# Patient Record
Sex: Male | Born: 1985 | Race: White | Hispanic: No | Marital: Married | Smoking: Never smoker
Health system: Southern US, Community
[De-identification: ages and names within clinical notes are randomized; demographics above are authoritative.]

## PROBLEM LIST (undated history)

## (undated) DIAGNOSIS — T4145XA Adverse effect of unspecified anesthetic, initial encounter: Secondary | ICD-10-CM

## (undated) DIAGNOSIS — R112 Nausea with vomiting, unspecified: Secondary | ICD-10-CM

## (undated) DIAGNOSIS — T8859XA Other complications of anesthesia, initial encounter: Secondary | ICD-10-CM

## (undated) DIAGNOSIS — M1990 Unspecified osteoarthritis, unspecified site: Secondary | ICD-10-CM

## (undated) DIAGNOSIS — R011 Cardiac murmur, unspecified: Secondary | ICD-10-CM

## (undated) DIAGNOSIS — Z9889 Other specified postprocedural states: Secondary | ICD-10-CM

## (undated) DIAGNOSIS — S7223XA Displaced subtrochanteric fracture of unspecified femur, initial encounter for closed fracture: Secondary | ICD-10-CM

## (undated) HISTORY — PX: EYE SURGERY: SHX253

## (undated) HISTORY — PX: TONSILLECTOMY: SUR1361

---
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
## 2004-10-07 ENCOUNTER — Ambulatory Visit: Payer: Self-pay | Admitting: Family Medicine

## 2009-03-16 ENCOUNTER — Ambulatory Visit: Payer: Self-pay | Admitting: Internal Medicine

## 2009-03-16 DIAGNOSIS — J020 Streptococcal pharyngitis: Secondary | ICD-10-CM | POA: Insufficient documentation

## 2009-03-16 LAB — CONVERTED CEMR LAB: Rapid Strep: POSITIVE

## 2010-02-02 NOTE — Assessment & Plan Note (Signed)
Summary: NEW / [employer]/#/CD   Vital Signs:  Patient profile:   25 year old male Height:      65 inches Weight:      203 pounds BMI:     33.90 O2 Sat:      97 % on Room air Temp:     97.8 degrees F oral Pulse rate:   78 / minute Pulse rhythm:   regular Resp:     16 per minute BP sitting:   118 / 88  (left arm) Cuff size:   large  Vitals Entered By: Rock Nephew CMA (March 16, 2009 1:22 PM)  Nutrition Counseling: Patient's BMI is greater than 25 and therefore counseled on weight management options.  O2 Flow:  Room air CC: headache, congestion, sore throat, chills, fever x 2-3 days, URI symptoms Is Patient Diabetic? No   Primary Care Provider:  Etta Grandchild MD  CC:  headache, congestion, sore throat, chills, fever x 2-3 days, and URI symptoms.  History of Present Illness:  URI Symptoms      This is a 25 year old man who presents with URI symptoms.  The symptoms began 4 days ago.  The severity is described as moderate.  The patient reports sore throat and sick contacts, but denies nasal congestion, clear nasal discharge, purulent nasal discharge, dry cough, productive cough, and earache.  Associated symptoms include fever of 100.5-103 degrees, use of an antipyretic, and response to antipyretic.  The patient denies stiff neck, dyspnea, wheezing, rash, vomiting, and diarrhea.  The patient denies headache, muscle aches, and severe fatigue.  Risk factors for Strep sinusitis include Strep exposure, tender adenopathy, and absence of cough.  The patient denies the following risk factors for Strep sinusitis: unilateral facial pain, unilateral nasal discharge, poor response to decongestant, double sickening, and tooth pain.    Preventive Screening-Counseling & Management  Alcohol-Tobacco     Alcohol drinks/day: 2     Alcohol type: all     >5/day in last 3 mos: no     Alcohol Counseling: not indicated; use of alcohol is not excessive or problematic     Feels need to cut down: no  Feels annoyed by complaints: no     Feels guilty re: drinking: no     Needs 'eye opener' in am: no     Smoking Status: never  Caffeine-Diet-Exercise     Does Patient Exercise: yes  Hep-HIV-STD-Contraception     Hepatitis Risk: no risk noted     HIV Risk: no risk noted     STD Risk: no risk noted      Sexual History:  currently monogamous.        Drug Use:  no.        Blood Transfusions:  no.    Medications Prior to Update: 1)  None  Current Medications (verified): 1)  Moxatag 775 Mg Xr24h-Tab (Amoxicillin) .... One By Mouth Once Daily  Allergies (verified): No Known Drug Allergies  Past History:  Past Medical History: Unremarkable  Past Surgical History: Tonsillectomy  Family History: Family History Diabetes 1st degree relative Family History Hypertension  Social History: Reviewed history and no changes required. Occupation: IT sales professional Single Never Smoked Alcohol use-yes Drug use-no Regular exercise-yes Smoking Status:  never Hepatitis Risk:  no risk noted HIV Risk:  no risk noted STD Risk:  no risk noted Sexual History:  currently monogamous Blood Transfusions:  no Drug Use:  no Does Patient Exercise:  yes  Review of Systems  The patient complains of fever and enlarged lymph nodes.  The patient denies anorexia, weight loss, weight gain, chest pain, dyspnea on exertion, peripheral edema, prolonged cough, headaches, hemoptysis, abdominal pain, suspicious skin lesions, and angioedema.    Physical Exam  General:  Well-developed,well-nourished,in no acute distress; alert,appropriate and cooperative throughout examination Head:  Normocephalic and atraumatic without obvious abnormalities. No apparent alopecia or balding. Eyes:  No corneal or conjunctival inflammation noted. EOMI. Perrla. Funduscopic exam benign, without hemorrhages, exudates or papilledema. Vision grossly normal. Ears:  External ear exam shows no significant lesions or deformities.   Otoscopic examination reveals clear canals, tympanic membranes are intact bilaterally without bulging, retraction, inflammation or discharge. Hearing is grossly normal bilaterally. Nose:  External nasal examination shows no deformity or inflammation. Nasal mucosa are pink and moist without lesions or exudates. Mouth:  good dentition, no lesions, no aphthous ulcers, no erosions, no tongue abnormalities, no leukoplakia, no petechiae, pharyngeal erythema, tonsil hypertropied, and pharyngeal exudate.   Neck:  supple, full ROM, no masses, no thyromegaly, no thyroid nodules or tenderness, no JVD, and cervical lymphadenopathy.   Lungs:  normal respiratory effort, no intercostal retractions, no accessory muscle use, normal breath sounds, no dullness, no fremitus, no crackles, and no wheezes.   Heart:  normal rate, regular rhythm, no murmur, no gallop, no rub, and no JVD.   Abdomen:  soft, non-tender, normal bowel sounds, no distention, no masses, no guarding, no rigidity, no rebound tenderness, no hepatomegaly, and no splenomegaly.   Msk:  No deformity or scoliosis noted of thoracic or lumbar spine.   Pulses:  R and L carotid,radial,femoral,dorsalis pedis and posterior tibial pulses are full and equal bilaterally Extremities:  No clubbing, cyanosis, edema, or deformity noted with normal full range of motion of all joints.   Neurologic:  No cranial nerve deficits noted. Station and gait are normal. Plantar reflexes are down-going bilaterally. DTRs are symmetrical throughout. Sensory, motor and coordinative functions appear intact. Skin:  Intact without suspicious lesions or rashes Cervical Nodes:  R anterior LN tender and L anterior LN tender.   Axillary Nodes:  no R axillary adenopathy and no L axillary adenopathy.   Inguinal Nodes:  no R inguinal adenopathy and no L inguinal adenopathy.   Psych:  Cognition and judgment appear intact. Alert and cooperative with normal attention span and concentration. No  apparent delusions, illusions, hallucinations   Impression & Recommendations:  Problem # 1:  STREP THROAT (ICD-034.0) Assessment New  His updated medication list for this problem includes:    Moxatag 775 Mg Xr24h-tab (Amoxicillin) ..... One by mouth once daily  Instructed to complete antibiotics and call if not improved in 48 hours.   Orders: Rapid Strep (16109)  Complete Medication List: 1)  Moxatag 775 Mg Xr24h-tab (Amoxicillin) .... One by mouth once daily  Patient Instructions: 1)  Please schedule a follow-up appointment in 2 weeks. 2)  Get plenty of rest, drink lots of clear liquids, and use Tylenol or Ibuprofen for fever and comfort. Return in 7-10 days if you're not better:sooner if you're feeling worse. 3)  Take 650-1000mg  of Tylenol every 4-6 hours as needed for relief of pain or comfort of fever AVOID taking more than 4000mg   in a 24 hour period (can cause liver damage in higher doses). 4)  Take 400-600mg  of Ibuprofen (Advil, Motrin) with food every 4-6 hours as needed for relief of pain or comfort of fever. 5)  Take your antibiotic as prescribed until ALL of it is gone, but  stop if you develop a rash or swelling and contact our office as soon as possible. Prescriptions: MOXATAG 775 MG XR24H-TAB (AMOXICILLIN) One by mouth once daily  #10 x 0   Entered and Authorized by:   Etta Grandchild MD   Signed by:   Etta Grandchild MD on 03/16/2009   Method used:   Samples Given   RxID:   [id number]   Preventive Care Screening  Last Tetanus Booster:    Date:  01/03/2006    Results:  Tdap     Immunization History:  Influenza Immunization History:    Influenza:  historical (10/03/2008)  Laboratory Results    Other Tests  Rapid Strep: positive

## 2011-05-26 ENCOUNTER — Ambulatory Visit (INDEPENDENT_AMBULATORY_CARE_PROVIDER_SITE_OTHER): Payer: BC Managed Care – PPO | Admitting: Physician Assistant

## 2011-05-26 VITALS — BP 108/72 | HR 61 | Temp 98.4°F | Resp 16 | Ht 65.0 in | Wt 218.2 lb

## 2011-05-26 DIAGNOSIS — J029 Acute pharyngitis, unspecified: Secondary | ICD-10-CM

## 2011-05-26 DIAGNOSIS — J020 Streptococcal pharyngitis: Secondary | ICD-10-CM

## 2011-05-26 LAB — POCT RAPID STREP A (OFFICE): Rapid Strep A Screen: NEGATIVE

## 2011-05-26 MED ORDER — IPRATROPIUM BROMIDE 0.06 % NA SOLN: 2.0000 | Freq: Three times a day (TID) | NASAL | Status: DC

## 2011-05-26 MED ORDER — AZITHROMYCIN 250 MG PO TABS: ORAL_TABLET | ORAL | Status: AC

## 2011-05-26 NOTE — Progress Notes (Signed)
Patient ID: LORENA CLEARMAN MRN: [id number], DOB: Nov 19, 1985, 26 y.o. Date of Encounter: 05/26/2011, 8:38 AM  Primary Physician: Sanda Linger, MD, MD  Chief Complaint:  Chief Complaint  Patient presents with  . Sore Throat    X 2 days  . Nasal Congestion    HPI: 26 y.o. year old male presents with a two day history of sore throat and one day history of nasal congestion. Subjective fever and chills. Mild cough, and congestion. No sinus pressure, otalgia, or headache. Cough is nonproductive, and not associated with time of day. Normal hearing. No GI complaints. Able to swallow saliva, but hurts to do so. Decreased appetite secondary to sore throat. His last episode of strep throat started the exact same way. At that time he did have a negative RST, but throat culture confirmed strep throat. Multiple sick contacts at work, and with the public.   No past medical history on file.   Home Meds: Prior to Admission medications   Not on File    Allergies: No Known Allergies  History   Social History  . Marital Status: Single    Spouse Name: N/A    Number of Children: N/A  . Years of Education: N/A   Occupational History  . Not on file.   Social History Main Topics  . Smoking status: Never Smoker   . Smokeless tobacco: Never Used  . Alcohol Use: No  . Drug Use: No  . Sexually Active: Not on file   Other Topics Concern  . Not on file   Social History Narrative  . No narrative on file     Review of Systems: Constitutional: negative for night sweats or weight changes HEENT: see above Cardiovascular: negative for chest pain or palpitations Respiratory: negative for hemoptysis, wheezing, or shortness of breath Abdominal: negative for abdominal pain, nausea, vomiting or diarrhea Dermatological: negative for rash Neurologic: negative for headache   Physical Exam: Blood pressure 108/72, pulse 61, temperature 98.4 F (36.9 C), temperature source Oral, resp. rate 16, height  5\' 5"  (1.651 m), weight 218 lb 3.2 oz (98.975 kg), SpO2 98.00%., Body mass index is 36.31 kg/(m^2). General: Well developed, well nourished, in no acute distress. Head: Normocephalic, atraumatic, eyes without discharge, sclera non-icteric, nares are patent. Bilateral auditory canals clear, TM's are without perforation, pearly grey with reflective cone of light bilaterally. No sinus TTP. Oral cavity moist, dentition normal. Posterior pharynx with post nasal drip and mild erythema. No peritonsillar abscess or tonsillar exudate. Neck: Supple. No thyromegaly. Full ROM. <2cm AC. Lungs: Clear bilaterally to auscultation without wheezes, rales, or rhonchi. Breathing is unlabored. Heart: RRR with S1 S2. No murmurs, rubs, or gallops appreciated. Msk:  Strength and tone normal for age. Extremities: No clubbing or cyanosis. No edema. Neuro: Alert and oriented X 3. Moves all extremities spontaneously. CNII-XII grossly in tact. Psych:  Responds to questions appropriately with a normal affect.   Labs: Results for orders placed in visit on 05/26/11  POCT RAPID STREP A (OFFICE)      Component Value Range   Rapid Strep A Screen Negative  Negative     TC pending  ASSESSMENT AND PLAN:  26 y.o. year old male with likely strep pharyngitis, treat secondary to symptoms and past -Azithromycin 250 MG #6 2 po first day then 1 po next 4 days no RF -Atrovent NS 0.06% 2 sprays each nare bid prn #1 no RF -Tylenol/Motrin prn -TC pending -New toothbrush -Rest/fluids -RTC precautions -RTC 3-5 days  if no improvement  Signed, Eula Listen, PA-C 05/26/2011 8:38 AM

## 2011-05-29 LAB — CULTURE, GROUP A STREP: Organism ID, Bacteria: NORMAL

## 2012-02-11 ENCOUNTER — Ambulatory Visit (INDEPENDENT_AMBULATORY_CARE_PROVIDER_SITE_OTHER): Payer: BC Managed Care – PPO | Admitting: Physician Assistant

## 2012-02-11 VITALS — BP 118/88 | HR 87 | Temp 98.5°F | Resp 18 | Ht 64.5 in | Wt 219.0 lb

## 2012-02-11 DIAGNOSIS — R05 Cough: Secondary | ICD-10-CM

## 2012-02-11 DIAGNOSIS — J029 Acute pharyngitis, unspecified: Secondary | ICD-10-CM

## 2012-02-11 DIAGNOSIS — R059 Cough, unspecified: Secondary | ICD-10-CM

## 2012-02-11 DIAGNOSIS — J329 Chronic sinusitis, unspecified: Secondary | ICD-10-CM

## 2012-02-11 MED ORDER — BENZONATATE 100 MG PO CAPS: 100.0000 mg | ORAL_CAPSULE | Freq: Three times a day (TID) | ORAL | Status: DC | PRN

## 2012-02-11 MED ORDER — CEFDINIR 300 MG PO CAPS: 300.0000 mg | ORAL_CAPSULE | Freq: Two times a day (BID) | ORAL | Status: DC

## 2012-02-11 MED ORDER — IPRATROPIUM BROMIDE 0.06 % NA SOLN: 2.0000 | Freq: Two times a day (BID) | NASAL | Status: DC

## 2012-02-11 NOTE — Progress Notes (Signed)
  Subjective:    Patient ID: Curtis Adams, male    DOB: December 03, 1985, 27 y.o.   MRN: [id number]  HPI 27 year old male presents with 5 day history of nasal congestion, sore throat, productive cough, and sinus pressure.  Has been using OTC antihistamine which has not been helping.  Admits to ear pressure but denies pain.  No fever, chills, nausea, vomiting, abdominal pain, headache, or dizziness.  He is otherwise healthy with no other complaints today. States he has had co-workers with similar illness. He works at the [employer].     Review of Systems  Constitutional: Negative for fever and chills.  HENT: Positive for congestion, sore throat (scratchy), rhinorrhea and postnasal drip. Negative for ear pain, neck pain and neck stiffness.   Respiratory: Positive for cough. Negative for shortness of breath and wheezing.   Cardiovascular: Negative for chest pain.  Gastrointestinal: Negative for nausea, vomiting and abdominal pain.  Neurological: Negative for dizziness and headaches.  All other systems reviewed and are negative.       Objective:   Physical Exam  Constitutional: He is oriented to person, place, and time. He appears well-developed and well-nourished.  HENT:  Head: Normocephalic and atraumatic.  Right Ear: External ear normal.  Left Ear: External ear normal.  Mouth/Throat: Oropharynx is clear and moist. No oropharyngeal exudate (clear postnasal drainage).  Eyes: Conjunctivae are normal.  Neck: Normal range of motion. Neck supple.  Cardiovascular: Normal rate, regular rhythm and normal heart sounds.   Pulmonary/Chest: Effort normal and breath sounds normal.  Lymphadenopathy:    He has no cervical adenopathy.  Neurological: He is alert and oriented to person, place, and time.  Psychiatric: He has a normal mood and affect. His behavior is normal. Judgment and thought content normal.          Assessment & Plan:   1. Sinusitis  cefdinir (OMNICEF) 300 MG capsule    cefdinir (OMNICEF) 300 MG capsule   ipratropium (ATROVENT) 0.06 % nasal spray  2. Acute pharyngitis    3. Cough  benzonatate (TESSALON) 100 MG capsule   benzonatate (TESSALON) 100 MG capsule  Omnicef bid x 7 days Atrovent NS bid for congestion Mucinex as directed Tessalon perles tid prn cough Follow up if symptoms worsen or fail to improve.

## 2013-06-01 ENCOUNTER — Ambulatory Visit (INDEPENDENT_AMBULATORY_CARE_PROVIDER_SITE_OTHER): Payer: BC Managed Care – PPO | Admitting: Emergency Medicine

## 2013-06-01 VITALS — BP 118/80 | HR 76 | Temp 97.8°F | Resp 18 | Ht 64.5 in | Wt 204.0 lb

## 2013-06-01 DIAGNOSIS — J029 Acute pharyngitis, unspecified: Secondary | ICD-10-CM

## 2013-06-01 DIAGNOSIS — J329 Chronic sinusitis, unspecified: Secondary | ICD-10-CM

## 2013-06-01 LAB — POCT RAPID STREP A (OFFICE): Rapid Strep A Screen: NEGATIVE

## 2013-06-01 MED ORDER — AZELASTINE HCL 0.1 % NA SOLN: 2.0000 | Freq: Two times a day (BID) | NASAL | Status: DC

## 2013-06-01 MED ORDER — AMOXICILLIN 875 MG PO TABS: 875.0000 mg | ORAL_TABLET | Freq: Two times a day (BID) | ORAL | Status: DC

## 2013-06-01 NOTE — Progress Notes (Signed)
   Subjective:    Patient ID: Curtis Adams, male    DOB: [birth year], 28 y.o.   MRN: [id number]  Sinusitis Associated symptoms include congestion, sinus pressure and a sore throat. Pertinent negatives include no chills or coughing.   Chief Complaint  Patient presents with  . Sinusitis   This chart was scribed for Lesle Chris, MD by Andrew Au, ED Scribe. This patient was seen in room 9 and the patient's care was started at 9:21 AM.  HPI Comments: Curtis Adams is a 28 y.o. male who presents to the [facility] complaining of sinus congestion onset 1 week with associated sore throat, and nasal drainage consisting of yellow sputum. Pt states he feels as if he has water in his ears due to the congestion. Pt denies cough and chest congestion. Pt denies sick contacts. Pt denies being allergic to drugs.  Pt works for the [employer] in [place].  No past medical history on file. No Known Allergies Prior to Admission medications   Medication Sig Start Date End Date Taking? Authorizing Provider  benzonatate (TESSALON) 100 MG capsule Take 1-2 capsules (100-200 mg total) by mouth 3 (three) times daily as needed for cough. 02/11/12   Nelva Nay, PA-C  cefdinir (OMNICEF) 300 MG capsule Take 1 capsule (300 mg total) by mouth 2 (two) times daily. 02/11/12   Nelva Nay, PA-C  ipratropium (ATROVENT) 0.06 % nasal spray Place 2 sprays into the nose 2 (two) times daily. 02/11/12   Nelva Nay, PA-C   Review of Systems  Constitutional: Negative for fever and chills.  HENT: Positive for congestion, sinus pressure and sore throat.   Respiratory: Negative for cough.        Objective:   Physical Exam CONSTITUTIONAL: Well developed/well nourished HEAD: Normocephalic/atraumatic EYES: EOMI/PERRL ENMT: Mucous membranes moist, TM serous otitis media of both ears, significant congestion of nasal mucosa with purulent discharge  Bilaterally, Throat is red without  exudate. NECK: supple no meningeal signs SPINE:entire spine nontender CV: S1/S2 noted, no murmurs/rubs/gallops noted LUNGS: Lungs are clear to auscultation bilaterally, no apparent distress ABDOMEN: soft, nontender, no rebound or guarding GU:no cva tenderness NEURO: Pt is awake/alert, moves all extremitiesx4 EXTREMITIES: pulses normal, full ROM SKIN: warm, color normal PSYCH: no abnormalities of mood noted  Results for orders placed in visit on 06/01/13  POCT RAPID STREP A (OFFICE)      Result Value Ref Range   Rapid Strep A Screen Negative  Negative      Assessment & Plan:    1. Pharyngitis   2. Sinusitis    Meds ordered this encounter  Medications  . amoxicillin (AMOXIL) 875 MG tablet    Sig: Take 1 tablet (875 mg total) by mouth 2 (two) times daily.    Dispense:  20 tablet    Refill:  0  . azelastine (ASTELIN) 0.1 % nasal spray    Sig: Place 2 sprays into both nostrils 2 (two) times daily. Use in each nostril as directed    Dispense:  30 mL    Refill:  1   Choose an acute sinusitis. Recheck if symptoms are persistent.

## 2013-06-01 NOTE — Patient Instructions (Signed)

## 2013-06-27 ENCOUNTER — Ambulatory Visit (INDEPENDENT_AMBULATORY_CARE_PROVIDER_SITE_OTHER): Payer: BC Managed Care – PPO | Admitting: Family Medicine

## 2013-06-27 VITALS — BP 120/80 | HR 66 | Temp 97.9°F | Resp 16

## 2013-06-27 DIAGNOSIS — S0180XA Unspecified open wound of other part of head, initial encounter: Secondary | ICD-10-CM

## 2013-06-27 DIAGNOSIS — S0181XA Laceration without foreign body of other part of head, initial encounter: Secondary | ICD-10-CM

## 2013-06-27 DIAGNOSIS — Z23 Encounter for immunization: Secondary | ICD-10-CM

## 2013-06-27 NOTE — Progress Notes (Signed)
Procedure:  Consent obtained.  Wound cleaned and closed with 5-0 Ethilon #1 corner stitch with good approximation.  Drsg placed.

## 2013-06-27 NOTE — Patient Instructions (Signed)

## 2013-06-27 NOTE — Progress Notes (Signed)
   Subjective:    Patient ID: Curtis Adams, male    DOB: 04/11/1985, 28 y.o.   MRN: [id number]  HPI Patient presents this morning with an injury to his forehead. He bent down to open a door and hit his head on a metal plate. It bled a lot at first, but he was able to stop the bleeding with pressure. He did not have any loss of consciousness.   Last tetanus 2008.   Review of Systems No fever, no headache, minimal pain. No recent illness.    Objective:   Physical Exam  Vitals reviewed. Constitutional: He is oriented to person, place, and time. He appears well-developed and well-nourished.  HENT:  Head: Normocephalic. Head is with laceration.    Patient with Y shaped 1 cm x .5 cm laceration on forehead.   Eyes: Conjunctivae are normal. Right eye exhibits no discharge. Left eye exhibits no discharge. No scleral icterus.  Neck: Normal range of motion. Neck supple.  Cardiovascular: Normal rate.   Pulmonary/Chest: Effort normal.  Musculoskeletal: Normal range of motion.  Neurological: He is alert and oriented to person, place, and time.  Skin: Skin is warm and dry.  Psychiatric: He has a normal mood and affect. His behavior is normal. Judgment and thought content normal.       Assessment & Plan:  1. Need for Tdap vaccination - Tdap vaccine greater than or equal to 7yo IM  2. Laceration of forehead, initial encounter -Written and verbal wound instructions given to patient. To return in 7 days for suture removal, sooner if signs/symptoms infection.   Emi Belfasteborah B. Gessner, FNP-BC  [facility]  06/27/2013 10:46 AM

## 2015-05-13 ENCOUNTER — Ambulatory Visit (INDEPENDENT_AMBULATORY_CARE_PROVIDER_SITE_OTHER): Payer: BLUE CROSS/BLUE SHIELD | Admitting: Urgent Care

## 2015-05-13 VITALS — BP 120/88 | HR 88 | Temp 99.2°F | Resp 18 | Ht 66.5 in | Wt 221.0 lb

## 2015-05-13 DIAGNOSIS — R195 Other fecal abnormalities: Secondary | ICD-10-CM | POA: Diagnosis not present

## 2015-05-13 DIAGNOSIS — R509 Fever, unspecified: Secondary | ICD-10-CM | POA: Diagnosis not present

## 2015-05-13 DIAGNOSIS — R52 Pain, unspecified: Secondary | ICD-10-CM | POA: Diagnosis not present

## 2015-05-13 LAB — POCT URINALYSIS DIP (MANUAL ENTRY)
Bilirubin, UA: NEGATIVE
Glucose, UA: NEGATIVE
Ketones, POC UA: NEGATIVE
Leukocytes, UA: NEGATIVE
Nitrite, UA: NEGATIVE
Protein Ur, POC: NEGATIVE
Spec Grav, UA: 1.015
Urobilinogen, UA: 1
pH, UA: 6.5

## 2015-05-13 LAB — POCT CBC
Granulocyte percent: 78.3 %G (ref 37–80)
HCT, POC: 41 % — AB (ref 43.5–53.7)
Hemoglobin: 14.5 g/dL (ref 14.1–18.1)
Lymph, poc: 2.1 (ref 0.6–3.4)
MCH, POC: 31.3 pg — AB (ref 27–31.2)
MCHC: 35.3 g/dL (ref 31.8–35.4)
MCV: 88.7 fL (ref 80–97)
MID (cbc): 1.1 — AB (ref 0–0.9)
MPV: 7.4 fL (ref 0–99.8)
POC Granulocyte: 11.5 — AB (ref 2–6.9)
POC LYMPH PERCENT: 14.1 %L (ref 10–50)
POC MID %: 7.6 %M (ref 0–12)
Platelet Count, POC: 261 10*3/uL (ref 142–424)
RBC: 4.63 M/uL — AB (ref 4.69–6.13)
RDW, POC: 13.8 %
WBC: 14.7 10*3/uL — AB (ref 4.6–10.2)

## 2015-05-13 LAB — COMPREHENSIVE METABOLIC PANEL
ALT: 17 U/L (ref 9–46)
AST: 13 U/L (ref 10–40)
Albumin: 4.5 g/dL (ref 3.6–5.1)
Alkaline Phosphatase: 78 U/L (ref 40–115)
BUN: 16 mg/dL (ref 7–25)
CO2: 26 mmol/L (ref 20–31)
Calcium: 9 mg/dL (ref 8.6–10.3)
Chloride: 103 mmol/L (ref 98–110)
Creat: 0.89 mg/dL (ref 0.60–1.35)
Glucose, Bld: 93 mg/dL (ref 65–99)
Potassium: 4.4 mmol/L (ref 3.5–5.3)
Sodium: 139 mmol/L (ref 135–146)
Total Bilirubin: 1 mg/dL (ref 0.2–1.2)
Total Protein: 7.4 g/dL (ref 6.1–8.1)

## 2015-05-13 LAB — POCT GLYCOSYLATED HEMOGLOBIN (HGB A1C): Hemoglobin A1C: 5.2

## 2015-05-13 LAB — POCT INFLUENZA A/B
Influenza A, POC: NEGATIVE
Influenza B, POC: NEGATIVE

## 2015-05-13 LAB — SEDIMENTATION RATE: Sed Rate: 18 mm/hr — ABNORMAL HIGH (ref 0–15)

## 2015-05-13 MED ORDER — DOXYCYCLINE HYCLATE 100 MG PO CAPS: 100.0000 mg | ORAL_CAPSULE | Freq: Two times a day (BID) | ORAL | Status: DC

## 2015-05-13 NOTE — Patient Instructions (Addendum)
Doxycycline tablets or capsules What is this medicine? DOXYCYCLINE (dox i SYE kleen) is a tetracycline antibiotic. It kills certain bacteria or stops their growth. It is used to treat many kinds of infections, like dental, skin, respiratory, and urinary tract infections. It also treats acne, Lyme disease, malaria, and certain sexually transmitted infections. This medicine may be used for other purposes; ask your health care provider or pharmacist if you have questions. What should I tell my health care provider before I take this medicine? They need to know if you have any of these conditions: -liver disease -long exposure to sunlight like working outdoors -stomach problems like colitis -an unusual or allergic reaction to doxycycline, tetracycline antibiotics, other medicines, foods, dyes, or preservatives -pregnant or trying to get pregnant -breast-feeding How should I use this medicine? Take this medicine by mouth with a full glass of water. Follow the directions on the prescription label. It is best to take this medicine without food, but if it upsets your stomach take it with food. Take your medicine at regular intervals. Do not take your medicine more often than directed. Take all of your medicine as directed even if you think you are better. Do not skip doses or stop your medicine early. Talk to your pediatrician regarding the use of this medicine in children. While this drug may be prescribed for selected conditions, precautions do apply. Overdosage: If you think you have taken too much of this medicine contact a poison control center or emergency room at once. NOTE: This medicine is only for you. Do not share this medicine with others. What if I miss a dose? If you miss a dose, take it as soon as you can. If it is almost time for your next dose, take only that dose. Do not take double or extra doses. What may interact with this medicine? -antacids -barbiturates -birth control  pills -bismuth subsalicylate -carbamazepine -methoxyflurane -other antibiotics -phenytoin -vitamins that contain iron -warfarin This list may not describe all possible interactions. Give your health care provider a list of all the medicines, herbs, non-prescription drugs, or dietary supplements you use. Also tell them if you smoke, drink alcohol, or use illegal drugs. Some items may interact with your medicine. What should I watch for while using this medicine? Tell your doctor or health care professional if your symptoms do not improve. Do not treat diarrhea with over the counter products. Contact your doctor if you have diarrhea that lasts more than 2 days or if it is severe and watery. Do not take this medicine just before going to bed. It may not dissolve properly when you lay down and can cause pain in your throat. Drink plenty of fluids while taking this medicine to also help reduce irritation in your throat. This medicine can make you more sensitive to the sun. Keep out of the sun. If you cannot avoid being in the sun, wear protective clothing and use sunscreen. Do not use sun lamps or tanning beds/booths. Birth control pills may not work properly while you are taking this medicine. Talk to your doctor about using an extra method of birth control. If you are being treated for a sexually transmitted infection, avoid sexual contact until you have finished your treatment. Your sexual partner may also need treatment. Avoid antacids, aluminum, calcium, magnesium, and iron products for 4 hours before and 2 hours after taking a dose of this medicine. If you are using this medicine to prevent malaria, you should still protect yourself from contact  with mosquitos. Stay in screened-in areas, use mosquito nets, keep your body covered, and use an insect repellent. What side effects may I notice from receiving this medicine? Side effects that you should report to your doctor or health care professional  as soon as possible: -allergic reactions like skin rash, itching or hives, swelling of the face, lips, or tongue -difficulty breathing -fever -itching in the rectal or genital area -pain on swallowing -redness, blistering, peeling or loosening of the skin, including inside the mouth -severe stomach pain or cramps -unusual bleeding or bruising -unusually weak or tired -yellowing of the eyes or skin Side effects that usually do not require medical attention (report to your doctor or health care professional if they continue or are bothersome): -diarrhea -loss of appetite -nausea, vomiting This list may not describe all possible side effects. Call your doctor for medical advice about side effects. You may report side effects to FDA at 1-800-FDA-1088. Where should I keep my medicine? Keep out of the reach of children. Store at room temperature, below 30 degrees C (86 degrees F). Protect from light. Keep container tightly closed. Throw away any unused medicine after the expiration date. Taking this medicine after the expiration date can make you seriously ill. NOTE: This sheet is a summary. It may not cover all possible information. If you have questions about this medicine, talk to your doctor, pharmacist, or health care provider.    2016, Elsevier/Gold Standard. (2014-04-11 12:10:28)     IF you received an x-ray today, you will receive an invoice from [facility]. Please contact [facility] at [phone] with questions or concerns regarding your invoice.   IF you received labwork today, you will receive an invoice from [facility]. Please contact [facility] at [phone] with questions or concerns regarding your invoice.   Our billing staff will not be able to assist you with questions regarding bills from these companies.  You will be contacted with the lab results as soon as they are available. The fastest way to get your results is to  activate your My Chart account. Instructions are located on the last page of this paperwork. If you have not heard from us regarding the results in 2 weeks, please contact this office.

## 2015-05-13 NOTE — Progress Notes (Signed)
MRN: [id number] DOB: 1985-12-28  Subjective:   Curtis Adams is a 30 y.o. male presenting for chief complaint of Fever; Diarrhea; and Generalized Body Aches  Reports 3 day history of worsening body aches, fever (highest 101F), intermittent stomach cramping, loose stools which are now improved. Has also had nasal congestion, drainage. Has tried Advil with some relief and Imodium which resolved the loose stools. Of note, patient has spent a lot of time outdoors but has not seen or felt any tick bites in particular. Denies nausea, vomiting, belly pain, chest pain, shob, sore throat, cough, rashes, red eyes. Does not actively practice healthy diet, no exercise. Denies smoking cigarettes or drinking alcohol.   Curtis Adams currently has no medications in their medication list. Also has No Known Allergies.  Curtis Adams  has no past medical history on file. Also  has past surgical history that includes Eye surgery.  Family history is negative for diabetes, heart disease, cancer.   Objective:   Vitals: BP 120/88 mmHg  Pulse 88  Temp(Src) 99.2 F (37.3 C) (Oral)  Resp 18  Ht 5' 6.5" (1.689 m)  Wt 221 lb (100.245 kg)  BMI 35.14 kg/m2  SpO2 98%  Physical Exam  Constitutional: He is oriented to person, place, and time. He appears well-developed and well-nourished.  HENT:  Mouth/Throat: Oropharynx is clear and moist.  Eyes: Pupils are equal, round, and reactive to light. Right eye exhibits no discharge. Left eye exhibits no discharge. No scleral icterus.  Neck: Normal range of motion. Neck supple.  Cardiovascular: Normal rate, regular rhythm and intact distal pulses.  Exam reveals no gallop and no friction rub.   No murmur heard. Pulmonary/Chest: No respiratory distress. He has no wheezes. He has no rales.  Abdominal: Soft. Bowel sounds are normal. He exhibits no distension and no mass. There is no tenderness.  No hepatosplenomegaly.  Musculoskeletal: He exhibits no edema.  Lymphadenopathy:   He has no cervical adenopathy.  Neurological: He is alert and oriented to person, place, and time.  Skin: Skin is warm and dry. No rash noted.   Results for orders placed or performed in visit on 05/13/15 (from the past 24 hour(s))  POCT urinalysis dipstick     Status: Abnormal   Collection Time: 05/13/15 10:29 AM  Result Value Ref Range   Color, UA yellow yellow   Clarity, UA clear clear   Glucose, UA negative negative   Bilirubin, UA negative negative   Ketones, POC UA negative negative   Spec Grav, UA 1.015    Blood, UA trace-lysed (A) negative   pH, UA 6.5    Protein Ur, POC negative negative   Urobilinogen, UA 1.0    Nitrite, UA Negative Negative   Leukocytes, UA Negative Negative  POCT Influenza A/B     Status: None   Collection Time: 05/13/15 10:38 AM  Result Value Ref Range   Influenza A, POC Negative Negative   Influenza B, POC Negative Negative  POCT CBC     Status: Abnormal   Collection Time: 05/13/15 10:40 AM  Result Value Ref Range   WBC 14.7 (A) 4.6 - 10.2 K/uL   Lymph, poc 2.1 0.6 - 3.4   POC LYMPH PERCENT 14.1 10 - 50 %L   MID (cbc) 1.1 (A) 0 - 0.9   POC MID % 7.6 0 - 12 %M   POC Granulocyte 11.5 (A) 2 - 6.9   Granulocyte percent 78.3 37 - 80 %G   RBC 4.63 (A) 4.69 -  6.13 M/uL   Hemoglobin 14.5 14.1 - 18.1 g/dL   HCT, POC 16.141.0 (A) 09.643.5 - 53.7 %   MCV 88.7 80 - 97 fL   MCH, POC 31.3 (A) 27 - 31.2 pg   MCHC 35.3 31.8 - 35.4 g/dL   RDW, POC 04.513.8 %   Platelet Count, POC 261 142 - 424 K/uL   MPV 7.4 0 - 99.8 fL  POCT glycosylated hemoglobin (Hb A1C)     Status: None   Collection Time: 05/13/15 10:40 AM  Result Value Ref Range   Hemoglobin A1C 5.2    Assessment and Plan :   1. Fever, unspecified 2. Body aches 3. Loose stools - Will cover for infectious process given elevated wbc with left shift. Start doxycycline, reviewed potential adverse effects, patient was in agreement. RTC in 1 week if no improvement.  Wallis BambergMario Akshath Mccarey, PA-C [facility] [phone] 05/13/2015 10:00 AM

## 2015-05-15 LAB — ROCKY MTN SPOTTED FVR ABS PNL(IGG+IGM)
RMSF IgG: NOT DETECTED
RMSF IgM: NOT DETECTED

## 2015-07-28 ENCOUNTER — Ambulatory Visit (INDEPENDENT_AMBULATORY_CARE_PROVIDER_SITE_OTHER): Payer: BLUE CROSS/BLUE SHIELD | Admitting: Physician Assistant

## 2015-07-28 VITALS — BP 124/86 | HR 86 | Temp 98.3°F | Resp 17 | Ht 66.5 in | Wt 220.0 lb

## 2015-07-28 DIAGNOSIS — J069 Acute upper respiratory infection, unspecified: Secondary | ICD-10-CM | POA: Diagnosis not present

## 2015-07-28 MED ORDER — AZITHROMYCIN 250 MG PO TABS: ORAL_TABLET | ORAL | 0 refills | Status: AC

## 2015-07-28 MED ORDER — PREDNISONE 20 MG PO TABS: 40.0000 mg | ORAL_TABLET | Freq: Every day | ORAL | 0 refills | Status: DC

## 2015-07-28 MED ORDER — BENZONATATE 200 MG PO CAPS: 200.0000 mg | ORAL_CAPSULE | Freq: Two times a day (BID) | ORAL | 0 refills | Status: DC | PRN

## 2015-07-28 NOTE — Progress Notes (Signed)
07/28/2015 4:43 PM   DOB: [birth year] / MRN: [id number]  SUBJECTIVE:  Curtis Adams is a 30 y.o. male presenting for cough and nasal congestion that has been present for about 3 weeks. Reports this started with a sore throat which has since resolved.  He has been taking benadryl, delsym, pseudoephedrine with some relief.   He reports the occasional HA with these symptoms.  He denies teeth pain, myalgia, appetite changes. He is a never smoker.   He has No Known Allergies.   He  has no past medical history on file.    He  reports that he has never smoked. He has never used smokeless tobacco. He reports that he does not drink alcohol or use drugs. He  has no sexual activity history on file. The patient  has a past surgical history that includes Eye surgery.  His family history is not on file.  Review of Systems  Constitutional: Negative for chills and fever.  HENT: Negative for ear pain.   Respiratory: Negative for hemoptysis.   Cardiovascular: Negative for chest pain.  Skin: Negative for rash.  Neurological: Negative for dizziness.    Problem list and medications reviewed and updated by myself where necessary, and exist elsewhere in the encounter.   OBJECTIVE:  BP 124/86 (BP Location: Right Arm, Patient Position: Sitting, Cuff Size: Normal)   Pulse 86   Temp 98.3 F (36.8 C) (Oral)   Resp 17   Ht 5' 6.5" (1.689 m)   Wt 220 lb (99.8 kg)   SpO2 98%   BMI 34.98 kg/m   Physical Exam  Constitutional: He is oriented to person, place, and time. He appears well-developed. He does not appear ill.  HENT:  Nose: Mucosal edema (beefy red) present. Right sinus exhibits no maxillary sinus tenderness and no frontal sinus tenderness. Left sinus exhibits no maxillary sinus tenderness and no frontal sinus tenderness.  Eyes: Conjunctivae and EOM are normal. Pupils are equal, round, and reactive to light.  Cardiovascular: Normal rate, regular rhythm and normal heart sounds.   Pulmonary/Chest:  Effort normal. No respiratory distress. He has no wheezes. He has no rales. He exhibits no tenderness.  Abdominal: He exhibits no distension.  Musculoskeletal: Normal range of motion.  Neurological: He is alert and oriented to person, place, and time. No cranial nerve deficit. Coordination normal.  Skin: Skin is warm and dry. He is not diaphoretic.  Psychiatric: He has a normal mood and affect.  Nursing note and vitals reviewed.   No results found for this or any previous visit (from the past 72 hour(s)).  No results found.  ASSESSMENT AND PLAN  Curtis Adams was seen today for cough and nasal congestion.  Diagnoses and all orders for this visit:  Protracted URI: He has been feeling poorly for two weeks now.  He may have contracted another cold or this could be a true sinusitis.  Will go ahead and treat with ABX based on symptom duration.   -     predniSONE (DELTASONE) 20 MG tablet; Take 2 tablets (40 mg total) by mouth daily with breakfast. -     azithromycin (ZITHROMAX) 250 MG tablet; Take 2 tabs PO x 1 dose, then 1 tab PO QD x 4 days -     benzonatate (TESSALON) 200 MG capsule; Take 1 capsule (200 mg total) by mouth 2 (two) times daily as needed for cough.    The patient was advised to call or return to clinic if he does not see  an improvement in symptoms, or to seek the care of the closest emergency department if he worsens with the above plan.   Deliah Boston, MHS, PA-C [facility] 07/28/2015 4:43 PM

## 2015-07-28 NOTE — Patient Instructions (Signed)
     IF you received an x-ray today, you will receive an invoice from [facility]. Please contact [facility] at [phone] with questions or concerns regarding your invoice.   IF you received labwork today, you will receive an invoice from [facility]. Please contact [facility] at [phone] with questions or concerns regarding your invoice.   Our billing staff will not be able to assist you with questions regarding bills from these companies.  You will be contacted with the lab results as soon as they are available. The fastest way to get your results is to activate your My Chart account. Instructions are located on the last page of this paperwork. If you have not heard from us regarding the results in 2 weeks, please contact this office.      

## 2016-10-04 DIAGNOSIS — R197 Diarrhea, unspecified: Secondary | ICD-10-CM | POA: Diagnosis not present

## 2016-10-04 DIAGNOSIS — K529 Noninfective gastroenteritis and colitis, unspecified: Secondary | ICD-10-CM | POA: Diagnosis not present

## 2017-01-11 DIAGNOSIS — M9905 Segmental and somatic dysfunction of pelvic region: Secondary | ICD-10-CM | POA: Diagnosis not present

## 2017-01-11 DIAGNOSIS — M545 Low back pain: Secondary | ICD-10-CM | POA: Diagnosis not present

## 2017-01-11 DIAGNOSIS — M9903 Segmental and somatic dysfunction of lumbar region: Secondary | ICD-10-CM | POA: Diagnosis not present

## 2017-01-11 DIAGNOSIS — M1612 Unilateral primary osteoarthritis, left hip: Secondary | ICD-10-CM | POA: Diagnosis not present

## 2017-01-11 DIAGNOSIS — M979XXS Periprosthetic fracture around unspecified internal prosthetic joint, sequela: Secondary | ICD-10-CM | POA: Diagnosis not present

## 2017-01-19 DIAGNOSIS — B349 Viral infection, unspecified: Secondary | ICD-10-CM | POA: Diagnosis not present

## 2017-01-19 DIAGNOSIS — J069 Acute upper respiratory infection, unspecified: Secondary | ICD-10-CM | POA: Diagnosis not present

## 2017-01-29 DIAGNOSIS — J0100 Acute maxillary sinusitis, unspecified: Secondary | ICD-10-CM | POA: Diagnosis not present

## 2017-02-17 DIAGNOSIS — M25552 Pain in left hip: Secondary | ICD-10-CM | POA: Diagnosis not present

## 2017-02-17 DIAGNOSIS — M1612 Unilateral primary osteoarthritis, left hip: Secondary | ICD-10-CM | POA: Diagnosis not present

## 2017-02-27 DIAGNOSIS — M25552 Pain in left hip: Secondary | ICD-10-CM | POA: Diagnosis not present

## 2017-03-06 DIAGNOSIS — M25552 Pain in left hip: Secondary | ICD-10-CM | POA: Diagnosis not present

## 2017-04-03 DIAGNOSIS — M25552 Pain in left hip: Secondary | ICD-10-CM | POA: Diagnosis not present

## 2017-04-05 ENCOUNTER — Other Ambulatory Visit: Payer: Self-pay

## 2017-04-05 ENCOUNTER — Other Ambulatory Visit: Payer: Self-pay | Admitting: Orthopaedic Surgery

## 2017-04-05 ENCOUNTER — Encounter (HOSPITAL_COMMUNITY): Payer: Self-pay | Admitting: *Deleted

## 2017-04-05 DIAGNOSIS — M84352A Stress fracture, left femur, initial encounter for fracture: Secondary | ICD-10-CM | POA: Diagnosis present

## 2017-04-05 NOTE — H&P (Signed)
Curtis Adams is an 32 y.o. male.   Chief Complaint: Left hip pain HPI: Curtis Adams is here again for follow-up of his left hip.  He's feeling more left groin pain.  We have him partial weightbearing with one crutch.  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  He is in with his mom, Curtis Adams, who has a visit at the same time.  He has some trouble resting at night.  He thinks overall things are worse despite this past month of relative rest.   Radiographs:  X-rays that were ordered, performed, and interpreted by me today included AP pelvis.  He has the same black line in the subtrochanteric region of the hip in the medial cortex.  If anything this looks a little bit worse.  He has some hypertrophy of the cortex consistent with attempted healing.  We reviewed his MRI scan at the last visit which was read normal by the radiologist.  We spoke with Dr. Jena GaussMaxwell who amended it to show that he does have a stress fracture in the subtrochanteric region.  No past medical history on file.  Past Surgical History:  Procedure Laterality Date  . EYE SURGERY      No family history on file. Social History:  reports that he has never smoked. He has never used smokeless tobacco. He reports that he does not drink alcohol or use drugs.  Allergies: No Known Allergies  No medications prior to admission.    No results found for this or any previous visit (from the past 48 hour(s)). No results found.  Review of Systems  Musculoskeletal: Positive for joint pain.       Left hip  All other systems reviewed and are negative.   There were no vitals taken for this visit. Physical Exam  Constitutional: He is oriented to person, place, and time. He appears well-developed and well-nourished.  HENT:  Head: Normocephalic and atraumatic.  Eyes: Pupils are equal, round, and reactive to light.  Neck: Normal range of motion.  Cardiovascular: Normal rate and regular rhythm.   Respiratory: Effort normal.  GI: Soft.  Musculoskeletal:  Left hip motion is painful.  He still is pain on internal rotation.  Leg lengths look equal.  He walks with an altered gait.  His skin is benign.  Sensation and motor function are intact distally with palpable pulses in his feet.    Neurological: He is alert and oriented to person, place, and time.  Skin: Skin is warm and dry.  Psychiatric: He has a normal mood and affect. His behavior is normal. Judgment and thought content normal.    Assessment/Plan Assessment: Left hip subtrochanteric stress fracture  Plan: Curtis Adams continues to struggle despite protected weightbearing status for the last month.  The x-ray today if anything looks worse.  There is certainly no emergency at work but I think his best option at this point would be intramedullary fixation.  He can certainly give it another month and continue on the crutch and follow-up though I doubt this is going to heal with anything short of intramedullary fixation.  If he comes to the surgery we can [facility] on an [facility] basis [facility] or the surgical center.  I would probably place a Biomet reconstruction type nail and leave it unlocked distally.  I reviewed risk of anesthesia and infection related to this intervention.  Maryiah Olvey, Ginger OrganNDREW PAUL, PA-C 04/05/2017, 1:51 PM

## 2017-04-05 NOTE — Progress Notes (Addendum)
Spoke with pt for pre-op call. Pt denies cardiac history, HTN or Diabetes. 

## 2017-04-06 ENCOUNTER — Encounter (HOSPITAL_COMMUNITY)
Admission: RE | Disposition: A | Discharge status: Home / Self Care | Payer: Self-pay | Source: Ambulatory Visit | Attending: Orthopaedic Surgery

## 2017-04-06 ENCOUNTER — Ambulatory Visit (HOSPITAL_COMMUNITY)
Admission: RE | Admit: 2017-04-06 | Discharge: 2017-04-06 | Disposition: A | Discharge status: Home / Self Care | Payer: BLUE CROSS/BLUE SHIELD | Source: Ambulatory Visit | Attending: Orthopaedic Surgery | Admitting: Orthopaedic Surgery

## 2017-04-06 ENCOUNTER — Encounter (HOSPITAL_COMMUNITY): Payer: Self-pay

## 2017-04-06 ENCOUNTER — Ambulatory Visit (HOSPITAL_COMMUNITY): Payer: BLUE CROSS/BLUE SHIELD

## 2017-04-06 ENCOUNTER — Ambulatory Visit (HOSPITAL_COMMUNITY): Payer: BLUE CROSS/BLUE SHIELD | Admitting: Anesthesiology

## 2017-04-06 DIAGNOSIS — S7292XD Unspecified fracture of left femur, subsequent encounter for closed fracture with routine healing: Secondary | ICD-10-CM | POA: Diagnosis not present

## 2017-04-06 DIAGNOSIS — M84352A Stress fracture, left femur, initial encounter for fracture: Secondary | ICD-10-CM

## 2017-04-06 DIAGNOSIS — Z419 Encounter for procedure for purposes other than remedying health state, unspecified: Secondary | ICD-10-CM

## 2017-04-06 DIAGNOSIS — Z6835 Body mass index (BMI) 35.0-35.9, adult: Secondary | ICD-10-CM | POA: Insufficient documentation

## 2017-04-06 DIAGNOSIS — E669 Obesity, unspecified: Secondary | ICD-10-CM | POA: Insufficient documentation

## 2017-04-06 DIAGNOSIS — J392 Other diseases of pharynx: Secondary | ICD-10-CM | POA: Diagnosis not present

## 2017-04-06 HISTORY — DX: Other complications of anesthesia, initial encounter: T88.59XA

## 2017-04-06 HISTORY — DX: Displaced subtrochanteric fracture of unspecified femur, initial encounter for closed fracture: S72.23XA

## 2017-04-06 HISTORY — DX: Unspecified osteoarthritis, unspecified site: M19.90

## 2017-04-06 HISTORY — PX: FEMUR IM NAIL: SHX1597

## 2017-04-06 HISTORY — DX: Other specified postprocedural states: R11.2

## 2017-04-06 HISTORY — DX: Adverse effect of unspecified anesthetic, initial encounter: T41.45XA

## 2017-04-06 HISTORY — DX: Cardiac murmur, unspecified: R01.1

## 2017-04-06 HISTORY — DX: Other specified postprocedural states: Z98.890

## 2017-04-06 LAB — CBC
HCT: 43.6 % (ref 39.0–52.0)
Hemoglobin: 14.6 g/dL (ref 13.0–17.0)
MCH: 30.4 pg (ref 26.0–34.0)
MCHC: 33.5 g/dL (ref 30.0–36.0)
MCV: 90.8 fL (ref 78.0–100.0)
Platelets: 312 10*3/uL (ref 150–400)
RBC: 4.8 MIL/uL (ref 4.22–5.81)
RDW: 13.1 % (ref 11.5–15.5)
WBC: 9 10*3/uL (ref 4.0–10.5)

## 2017-04-06 LAB — BASIC METABOLIC PANEL
Anion gap: 11 (ref 5–15)
BUN: 16 mg/dL (ref 6–20)
CO2: 21 mmol/L — ABNORMAL LOW (ref 22–32)
Calcium: 8.9 mg/dL (ref 8.9–10.3)
Chloride: 104 mmol/L (ref 101–111)
Creatinine, Ser: 0.83 mg/dL (ref 0.61–1.24)
GFR calc Af Amer: >60 mL/min (ref >60)
GFR calc non Af Amer: >60 mL/min (ref >60)
Glucose, Bld: 94 mg/dL (ref 65–99)
Potassium: 4.6 mmol/L (ref 3.5–5.1)
Sodium: 136 mmol/L (ref 135–145)

## 2017-04-06 SURGERY — INSERTION, INTRAMEDULLARY ROD, FEMUR
Anesthesia: General | Site: Hip | Laterality: Left

## 2017-04-06 MED ORDER — LACTATED RINGERS IV SOLN
INTRAVENOUS | Status: DC
  Administered 2017-04-06: 08:00:00 via INTRAVENOUS

## 2017-04-06 MED ORDER — HYDROMORPHONE HCL 1 MG/ML IJ SOLN
0.2500 mg | INTRAMUSCULAR | Status: DC | PRN
  Administered 2017-04-06 (×3): 0.5 mg via INTRAVENOUS

## 2017-04-06 MED ORDER — PROMETHAZINE HCL 12.5 MG PO TABS: 12.5000 mg | ORAL_TABLET | Freq: Four times a day (QID) | ORAL | 0 refills | Status: DC | PRN

## 2017-04-06 MED ORDER — LIDOCAINE HCL (CARDIAC) 20 MG/ML IV SOLN
INTRAVENOUS | Status: DC | PRN
  Administered 2017-04-06: 60 mg via INTRAVENOUS

## 2017-04-06 MED ORDER — PROPOFOL 10 MG/ML IV BOLUS
INTRAVENOUS | Status: AC
  Filled 2017-04-06: qty 40

## 2017-04-06 MED ORDER — OXYCODONE-ACETAMINOPHEN 5-325 MG PO TABS: 1.0000 | ORAL_TABLET | Freq: Four times a day (QID) | ORAL | 0 refills | Status: DC | PRN

## 2017-04-06 MED ORDER — KETOROLAC TROMETHAMINE 30 MG/ML IJ SOLN
INTRAMUSCULAR | Status: AC
  Filled 2017-04-06: qty 1

## 2017-04-06 MED ORDER — DEXAMETHASONE SODIUM PHOSPHATE 10 MG/ML IJ SOLN
INTRAMUSCULAR | Status: AC
  Filled 2017-04-06: qty 1

## 2017-04-06 MED ORDER — OXYCODONE-ACETAMINOPHEN 5-325 MG PO TABS
1.0000 | ORAL_TABLET | Freq: Once | ORAL | Status: AC
  Administered 2017-04-06: 1 via ORAL

## 2017-04-06 MED ORDER — ONDANSETRON HCL 4 MG/2ML IJ SOLN
INTRAMUSCULAR | Status: AC
  Filled 2017-04-06: qty 2

## 2017-04-06 MED ORDER — LIDOCAINE HCL (CARDIAC) 20 MG/ML IV SOLN
INTRAVENOUS | Status: AC
  Filled 2017-04-06: qty 5

## 2017-04-06 MED ORDER — CEFAZOLIN SODIUM-DEXTROSE 2-4 GM/100ML-% IV SOLN
2.0000 g | INTRAVENOUS | Status: AC
  Administered 2017-04-06: 2 g via INTRAVENOUS

## 2017-04-06 MED ORDER — BUPIVACAINE IN DEXTROSE 0.75-8.25 % IT SOLN
INTRATHECAL | Status: DC | PRN
  Administered 2017-04-06: 1.8 mL via INTRATHECAL

## 2017-04-06 MED ORDER — OXYCODONE-ACETAMINOPHEN 5-325 MG PO TABS
ORAL_TABLET | ORAL | Status: AC
  Filled 2017-04-06: qty 1

## 2017-04-06 MED ORDER — PROPOFOL 500 MG/50ML IV EMUL
INTRAVENOUS | Status: DC | PRN
  Administered 2017-04-06: 80 ug/kg/min via INTRAVENOUS

## 2017-04-06 MED ORDER — LACTATED RINGERS IV SOLN: INTRAVENOUS | Status: DC

## 2017-04-06 MED ORDER — MEPERIDINE HCL 50 MG/ML IJ SOLN: 6.2500 mg | INTRAMUSCULAR | Status: DC | PRN

## 2017-04-06 MED ORDER — DEXTROSE 5 % IV SOLN: 3.0000 g | INTRAVENOUS | Status: DC

## 2017-04-06 MED ORDER — MIDAZOLAM HCL 2 MG/2ML IJ SOLN
INTRAMUSCULAR | Status: AC
  Filled 2017-04-06: qty 2

## 2017-04-06 MED ORDER — DEXAMETHASONE SODIUM PHOSPHATE 10 MG/ML IJ SOLN
INTRAMUSCULAR | Status: DC | PRN
  Administered 2017-04-06: 5 mg via INTRAVENOUS

## 2017-04-06 MED ORDER — MIDAZOLAM HCL 5 MG/5ML IJ SOLN
INTRAMUSCULAR | Status: DC | PRN
  Administered 2017-04-06: 2 mg via INTRAVENOUS

## 2017-04-06 MED ORDER — PROPOFOL 10 MG/ML IV BOLUS
INTRAVENOUS | Status: DC | PRN
  Administered 2017-04-06: 70 mg via INTRAVENOUS

## 2017-04-06 MED ORDER — KETOROLAC TROMETHAMINE 30 MG/ML IJ SOLN
30.0000 mg | Freq: Once | INTRAMUSCULAR | Status: AC | PRN
  Administered 2017-04-06: 30 mg via INTRAVENOUS

## 2017-04-06 MED ORDER — PROMETHAZINE HCL 25 MG/ML IJ SOLN
6.2500 mg | INTRAMUSCULAR | Status: DC | PRN
  Administered 2017-04-06: 6.25 mg via INTRAVENOUS

## 2017-04-06 MED ORDER — PROMETHAZINE HCL 25 MG/ML IJ SOLN
INTRAMUSCULAR | Status: AC
  Filled 2017-04-06: qty 1

## 2017-04-06 MED ORDER — FENTANYL CITRATE (PF) 100 MCG/2ML IJ SOLN
INTRAMUSCULAR | Status: DC | PRN
  Administered 2017-04-06 (×2): 50 ug via INTRAVENOUS
  Administered 2017-04-06: 25 ug via INTRAVENOUS
  Administered 2017-04-06 (×2): 50 ug via INTRAVENOUS
  Administered 2017-04-06: 25 ug via INTRAVENOUS

## 2017-04-06 MED ORDER — CHLORHEXIDINE GLUCONATE 4 % EX LIQD: 60.0000 mL | Freq: Once | CUTANEOUS | Status: DC

## 2017-04-06 MED ORDER — 0.9 % SODIUM CHLORIDE (POUR BTL) OPTIME
TOPICAL | Status: DC | PRN
  Administered 2017-04-06: 1000 mL

## 2017-04-06 MED ORDER — HYDROMORPHONE HCL 1 MG/ML IJ SOLN
INTRAMUSCULAR | Status: AC
  Administered 2017-04-06: 0.5 mg via INTRAVENOUS
  Filled 2017-04-06: qty 1

## 2017-04-06 MED ORDER — FENTANYL CITRATE (PF) 250 MCG/5ML IJ SOLN
INTRAMUSCULAR | Status: AC
  Filled 2017-04-06: qty 5

## 2017-04-06 MED ORDER — ONDANSETRON HCL 4 MG/2ML IJ SOLN
INTRAMUSCULAR | Status: DC | PRN
  Administered 2017-04-06: 4 mg via INTRAVENOUS

## 2017-04-06 MED ORDER — PROPOFOL 1000 MG/100ML IV EMUL
INTRAVENOUS | Status: AC
  Filled 2017-04-06: qty 100

## 2017-04-06 SURGICAL SUPPLY — 39 items
BIT DRILL 5.3 (BIT) ×1 IMPLANT
BIT DRILL 6.5X4.8 (BIT) ×1 IMPLANT
COVER BACK TABLE 80X110 HD (DRAPES) ×2 IMPLANT
COVER PERINEAL POST (MISCELLANEOUS) ×2 IMPLANT
DRAPE IMP U-DRAPE 54X76 (DRAPES) ×2 IMPLANT
DRAPE STERI IOBAN 125X83 (DRAPES) ×2 IMPLANT
DRSG AQUACEL AG ADV 3.5X 4 (GAUZE/BANDAGES/DRESSINGS) ×1 IMPLANT
DRSG AQUACEL AG ADV 3.5X 6 (GAUZE/BANDAGES/DRESSINGS) ×1 IMPLANT
DURAPREP 26ML APPLICATOR (WOUND CARE) ×2 IMPLANT
ELECT CAUTERY BLADE 6.4 (BLADE) ×2 IMPLANT
ELECT REM PT RETURN 9FT ADLT (ELECTROSURGICAL) ×2
ELECTRODE REM PT RTRN 9FT ADLT (ELECTROSURGICAL) ×1 IMPLANT
GLOVE BIO SURGEON STRL SZ8 (GLOVE) ×8 IMPLANT
GLOVE BIOGEL PI IND STRL 8 (GLOVE) ×1 IMPLANT
GLOVE BIOGEL PI INDICATOR 8 (GLOVE) ×1
GOWN STRL REUS W/ TWL XL LVL3 (GOWN DISPOSABLE) ×3 IMPLANT
GOWN STRL REUS W/TWL 2XL LVL3 (GOWN DISPOSABLE) ×4 IMPLANT
GOWN STRL REUS W/TWL XL LVL3 (GOWN DISPOSABLE) ×6
GUIDEPIN 3.2X17.5 THRD DISP (PIN) ×1 IMPLANT
GUIDEWIRE BALL NOSE 100CM (WIRE) ×1 IMPLANT
KIT BASIN OR (CUSTOM PROCEDURE TRAY) ×2 IMPLANT
KIT TURNOVER KIT B (KITS) ×2 IMPLANT
LINER BOOT UNIVERSAL DISP (MISCELLANEOUS) ×2 IMPLANT
MANIFOLD NEPTUNE II (INSTRUMENTS) ×2 IMPLANT
NAIL TROCH LH 9X30 (Nail) ×2 IMPLANT
NS IRRIG 1000ML POUR BTL (IV SOLUTION) ×2 IMPLANT
PACK GENERAL/GYN (CUSTOM PROCEDURE TRAY) ×2 IMPLANT
PACK UNIVERSAL I (CUSTOM PROCEDURE TRAY) ×2 IMPLANT
PAD ARMBOARD 7.5X6 YLW CONV (MISCELLANEOUS) ×5 IMPLANT
SCREW ACE CORTICAL 6.5X70MML (Screw) ×1 IMPLANT
SCREW LAG 6.5X80MM (Screw) ×1 IMPLANT
STAPLER VISISTAT 35W (STAPLE) ×1 IMPLANT
STRIP CLOSURE SKIN 1/2X4 (GAUZE/BANDAGES/DRESSINGS) ×1 IMPLANT
SUT VIC AB 0 CT1 27 (SUTURE) ×4
SUT VIC AB 0 CT1 27XBRD ANBCTR (SUTURE) ×2 IMPLANT
SUT VIC AB 1 CTB1 27 (SUTURE) ×4 IMPLANT
SUT VIC AB 2-0 CT1 27 (SUTURE) ×4
SUT VIC AB 2-0 CT1 TAPERPNT 27 (SUTURE) ×2 IMPLANT
TOWEL OR 17X26 10 PK STRL BLUE (TOWEL DISPOSABLE) ×2 IMPLANT

## 2017-04-06 NOTE — Anesthesia Procedure Notes (Signed)
Spinal  Start time: 04/06/2017 9:39 AM End time: 04/06/2017 9:41 AM Staffing Anesthesiologist: Leilani AbleHatchett, Hazley Dezeeuw, MD Performed: anesthesiologist  Preanesthetic Checklist Completed: patient identified, site marked, surgical consent, pre-op evaluation, timeout performed, IV checked, risks and benefits discussed and monitors and equipment checked Spinal Block Patient position: sitting Prep: site prepped and draped and DuraPrep Patient monitoring: continuous pulse ox and blood pressure Approach: midline Location: L3-4 Injection technique: single-shot Needle Needle type: Pencan  Needle gauge: 24 G Needle length: 10 cm Needle insertion depth: 5 cm Assessment Sensory level: T8

## 2017-04-06 NOTE — Progress Notes (Signed)
Patient able to ambulate to bathroom with standby assist. Pt and wife feel confident with being able to safely ambulate and get inside the house. Able to void. Will proceed with discharge.

## 2017-04-06 NOTE — Op Note (Signed)
NAME:  Curtis Adams, BOISSONNEAULT                    ACCOUNT NO.:  MEDICAL RECORD NO.:  [id number]  LOCATION:                                 FACILITY:  PHYSICIAN:  Monico Blitz. Rhona Raider, M.D.     DATE OF BIRTH:  DATE OF PROCEDURE:  04/06/2017 DATE OF DISCHARGE:                              OPERATIVE REPORT   PREOPERATIVE DIAGNOSIS:  Left subtrochanteric stress fracture.  POSTOPERATIVE DIAGNOSIS:  Left subtrochanteric stress fracture.  PROCEDURE:  Left femur intramedullary nail.  ANESTHESIA:  Spinal and MAC  ATTENDING SURGEON:  Monico Blitz. Rhona Raider, M.D.  ASSISTANT:  Loni Dolly, PA.  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  This has persisted despite multiple conservative measures including partial weightbearing with crutches.  By x-ray and MRI, he has a subtrochanteric stress fracture. This does not appear to be healing.  He is offered intramedullary fixation at this point in hopes of encouraging this to heal.  Informed operative consent was obtained after discussion of possible complications including reaction to anesthesia, infection, and continued poor healing.  DESCRIPTION OF PROCEDURE:  The patient was taken to the operating suite where spinal anesthesia was applied without difficulty.  He was also given some sedation.  He was positioned supine on the Hana table.  He was then prepped and draped in a normal sterile fashion.  After the administration of preop IV Kefzol and an appropriate time-out, we made a small incision superior to the greater trochanter.  Dissection was carried down to the greater trochanter at an appropriate entry point using an awl.  I then placed a guidewire past the lesser trochanter, confirmed to be intramedullary on 2 fluoroscopy views.  I over-reamed with the introductory reamer and then placed a guidewire down to the knee again, confirmed to be intramedullary on AP and lateral views  with fluoroscopy.  We then reamed up to a diameter of 11 mm.  I took a 12-mm reamer and brought it down to the area of his nonunion to irritate this more significantly.  We then placed a 9 x 30 VersaNail in appropriate position.  We locked it proximally with one screw up the femoral neck and one from the greater trochanter down towards the lesser trochanter. He had excellent bone quality.  We elected not to lock distally.  The wounds were then thoroughly irrigated.  I used fluoroscopy to confirm adequate placement of hardware and read these views myself.  We reapproximated deep tissues with Vicryl and subcutaneous tissues with Vicryl.  Skin was closed with nylon.  We injected some local Marcaine about the incision site followed by dry gauze and tape.  Estimated blood loss and intraoperative fluids can be obtained from anesthesia records.  DISPOSITION:  The patient was taken to the recovery room in stable condition.  Plans were for him to hopefully go home same day and follow up in the office next week.  I will contact him by phone tonight.     Monico Blitz Rhona Raider, M.D.     PGD/MEDQ  D:  04/06/2017  T:  04/06/2017  Job:  [id number]

## 2017-04-06 NOTE — Anesthesia Preprocedure Evaluation (Addendum)
Anesthesia Evaluation  Patient identified by MRN, date of birth, ID band Patient awake    Reviewed: Allergy & Precautions, NPO status , Patient's Chart, lab work & pertinent test results  History of Anesthesia Complications (+) PONV and history of anesthetic complications  Airway Mallampati: I       Dental no notable dental hx. (+) Teeth Intact   Pulmonary neg pulmonary ROS,    Pulmonary exam normal breath sounds clear to auscultation       Cardiovascular negative cardio ROS Normal cardiovascular exam Rhythm:Regular Rate:Normal     Neuro/Psych negative neurological ROS  negative psych ROS   GI/Hepatic negative GI ROS, Neg liver ROS,   Endo/Other  negative endocrine ROS  Renal/GU negative Renal ROS  negative genitourinary   Musculoskeletal   Abdominal (+) + obese,   Peds  Hematology negative hematology ROS (+)   Anesthesia Other Findings   Reproductive/Obstetrics                            Anesthesia Physical Anesthesia Plan  ASA: II  Anesthesia Plan: Spinal and MAC   Post-op Pain Management:    Induction:   PONV Risk Score and Plan: 2 and Ondansetron, Dexamethasone and Midazolam  Airway Management Planned: Natural Airway, Simple Face Mask and Nasal Cannula  Additional Equipment:   Intra-op Plan:   Post-operative Plan:   Informed Consent: I have reviewed the patients History and Physical, chart, labs and discussed the procedure including the risks, benefits and alternatives for the proposed anesthesia with the patient or authorized representative who has indicated his/her understanding and acceptance.     Plan Discussed with: CRNA and Surgeon  Anesthesia Plan Comments:       Anesthesia Quick Evaluation

## 2017-04-06 NOTE — Op Note (Signed)
EH SESAY [id number] 04/06/2017   PRE-OP DIAGNOSIS: left subtroch stress fracture  POST-OP DIAGNOSIS: same  PROCEDURE: left femur IM nail  ANESTHESIA: spinal and MAC  Bettye Sitton G   Dictation #:  [id number]

## 2017-04-06 NOTE — Interval H&P Note (Signed)
History and Physical Interval Note:  04/06/2017 8:49 AM  Curtis FurnaceJoshua H Ast  has presented today for surgery, with the diagnosis of LEFT HIP SUBTROCHANTERIC FRACTURE  The various methods of treatment have been discussed with the patient and family. After consideration of risks, benefits and other options for treatment, the patient has consented to  Procedure(s) with comments: INTRAMEDULLARY (IM) NAIL LEFT HIP RECON NAILING (Left) - BIOMET/ZIMMER LEFT HIP RECON NAILING (NOT FIBULA) as a surgical intervention .  The patient's history has been reviewed, patient examined, no change in status, stable for surgery.  I have reviewed the patient's chart and labs.  Questions were answered to the patient's satisfaction.     [name]

## 2017-04-06 NOTE — Anesthesia Postprocedure Evaluation (Signed)
Anesthesia Post Note  Patient: Dorian FurnaceJoshua H Busby  Procedure(s) Performed: INTRAMEDULLARY (IM) NAIL LEFT HIP RECON NAILING (Left Hip)     Patient location during evaluation: PACU Anesthesia Type: General Level of consciousness: awake Pain management: pain level controlled Vital Signs Assessment: post-procedure vital signs reviewed and stable Respiratory status: spontaneous breathing Cardiovascular status: stable Postop Assessment: no headache, no backache, spinal receding and patient able to bend at knees Anesthetic complications: yes Anesthetic complication details: PONV   Last Vitals:  Vitals:   04/06/17 1335 04/06/17 1411  BP: 113/82 115/66  Pulse: 76 67  Resp: 20   Temp: 36.7 C   SpO2: 96% 93%    Last Pain:  Vitals:   04/06/17 1345  TempSrc:   PainSc: 2    Pain Goal:                 Elexis Pollak JR,JOHN Ourania Hamler

## 2017-04-06 NOTE — Progress Notes (Signed)
Patient moved to phase 2, still awaiting spinal to completely wear off. Patient attempted to stand, but unsteady on his feet. Not able to void at current time due to numbness in bladder & perineum due to spinal. Not safe to go home at this time without being able to walk and void.  Spouse at bedside, aware of plan. Will continue to monitor and discharge when able.

## 2017-04-06 NOTE — Transfer of Care (Signed)
Immediate Anesthesia Transfer of Care Note  Patient: Curtis Adams  Procedure(s) Performed: INTRAMEDULLARY (IM) NAIL LEFT HIP RECON NAILING (Left Hip)  Patient Location: PACU  Anesthesia Type:Spinal and MAC combined with regional for post-op pain  Level of Consciousness: awake, alert , oriented and patient cooperative  Airway & Oxygen Therapy: Patient Spontanous Breathing and Patient connected to face mask oxygen  Post-op Assessment: Report given to RN and Post -op Vital signs reviewed and stable  Post vital signs: Reviewed and stable  Last Vitals:  Vitals Value Taken Time  BP 138/77 04/06/2017 11:20 AM  Temp 36.7 C 04/06/2017 11:20 AM  Pulse 86 04/06/2017 11:21 AM  Resp 17 04/06/2017 11:21 AM  SpO2 97 % 04/06/2017 11:21 AM  Vitals shown include unvalidated device data.  Last Pain:  Vitals:   04/06/17 0730  TempSrc: Oral         Complications: No apparent anesthesia complications

## 2017-04-07 ENCOUNTER — Encounter (HOSPITAL_COMMUNITY): Payer: Self-pay | Admitting: Orthopaedic Surgery

## 2017-04-13 ENCOUNTER — Encounter (HOSPITAL_COMMUNITY): Payer: Self-pay | Admitting: Orthopaedic Surgery

## 2017-04-14 DIAGNOSIS — M84352D Stress fracture, left femur, subsequent encounter for fracture with routine healing: Secondary | ICD-10-CM | POA: Diagnosis not present

## 2017-04-19 ENCOUNTER — Encounter (HOSPITAL_COMMUNITY): Payer: Self-pay | Admitting: Orthopaedic Surgery

## 2017-04-20 ENCOUNTER — Encounter (HOSPITAL_COMMUNITY): Payer: Self-pay | Admitting: Orthopaedic Surgery

## 2017-04-24 ENCOUNTER — Encounter (HOSPITAL_COMMUNITY): Payer: Self-pay | Admitting: Orthopaedic Surgery

## 2017-04-28 DIAGNOSIS — M25552 Pain in left hip: Secondary | ICD-10-CM | POA: Diagnosis not present

## 2017-05-04 DIAGNOSIS — Z4789 Encounter for other orthopedic aftercare: Secondary | ICD-10-CM | POA: Diagnosis not present

## 2017-05-04 DIAGNOSIS — R262 Difficulty in walking, not elsewhere classified: Secondary | ICD-10-CM | POA: Diagnosis not present

## 2017-05-04 DIAGNOSIS — M25552 Pain in left hip: Secondary | ICD-10-CM | POA: Diagnosis not present

## 2017-05-05 DIAGNOSIS — M9905 Segmental and somatic dysfunction of pelvic region: Secondary | ICD-10-CM | POA: Diagnosis not present

## 2017-05-05 DIAGNOSIS — M9903 Segmental and somatic dysfunction of lumbar region: Secondary | ICD-10-CM | POA: Diagnosis not present

## 2017-05-05 DIAGNOSIS — M979XXS Periprosthetic fracture around unspecified internal prosthetic joint, sequela: Secondary | ICD-10-CM | POA: Diagnosis not present

## 2017-05-05 DIAGNOSIS — M545 Low back pain: Secondary | ICD-10-CM | POA: Diagnosis not present

## 2017-05-09 DIAGNOSIS — R262 Difficulty in walking, not elsewhere classified: Secondary | ICD-10-CM | POA: Diagnosis not present

## 2017-05-09 DIAGNOSIS — Z4789 Encounter for other orthopedic aftercare: Secondary | ICD-10-CM | POA: Diagnosis not present

## 2017-05-09 DIAGNOSIS — M25552 Pain in left hip: Secondary | ICD-10-CM | POA: Diagnosis not present

## 2017-05-11 DIAGNOSIS — M25552 Pain in left hip: Secondary | ICD-10-CM | POA: Diagnosis not present

## 2017-05-11 DIAGNOSIS — Z4789 Encounter for other orthopedic aftercare: Secondary | ICD-10-CM | POA: Diagnosis not present

## 2017-05-11 DIAGNOSIS — R262 Difficulty in walking, not elsewhere classified: Secondary | ICD-10-CM | POA: Diagnosis not present

## 2017-05-15 DIAGNOSIS — Z4789 Encounter for other orthopedic aftercare: Secondary | ICD-10-CM | POA: Diagnosis not present

## 2017-05-15 DIAGNOSIS — R262 Difficulty in walking, not elsewhere classified: Secondary | ICD-10-CM | POA: Diagnosis not present

## 2017-05-15 DIAGNOSIS — M25552 Pain in left hip: Secondary | ICD-10-CM | POA: Diagnosis not present

## 2017-05-17 DIAGNOSIS — Z4789 Encounter for other orthopedic aftercare: Secondary | ICD-10-CM | POA: Diagnosis not present

## 2017-05-17 DIAGNOSIS — S01111A Laceration without foreign body of right eyelid and periocular area, initial encounter: Secondary | ICD-10-CM | POA: Diagnosis not present

## 2017-05-17 DIAGNOSIS — M25552 Pain in left hip: Secondary | ICD-10-CM | POA: Diagnosis not present

## 2017-05-17 DIAGNOSIS — R262 Difficulty in walking, not elsewhere classified: Secondary | ICD-10-CM | POA: Diagnosis not present

## 2017-05-22 DIAGNOSIS — M25552 Pain in left hip: Secondary | ICD-10-CM | POA: Diagnosis not present

## 2017-05-22 DIAGNOSIS — R262 Difficulty in walking, not elsewhere classified: Secondary | ICD-10-CM | POA: Diagnosis not present

## 2017-05-22 DIAGNOSIS — Z4789 Encounter for other orthopedic aftercare: Secondary | ICD-10-CM | POA: Diagnosis not present

## 2017-05-24 DIAGNOSIS — M25552 Pain in left hip: Secondary | ICD-10-CM | POA: Diagnosis not present

## 2017-05-24 DIAGNOSIS — R262 Difficulty in walking, not elsewhere classified: Secondary | ICD-10-CM | POA: Diagnosis not present

## 2017-05-24 DIAGNOSIS — Z4789 Encounter for other orthopedic aftercare: Secondary | ICD-10-CM | POA: Diagnosis not present

## 2017-05-30 DIAGNOSIS — R262 Difficulty in walking, not elsewhere classified: Secondary | ICD-10-CM | POA: Diagnosis not present

## 2017-05-30 DIAGNOSIS — M25552 Pain in left hip: Secondary | ICD-10-CM | POA: Diagnosis not present

## 2017-05-30 DIAGNOSIS — Z4789 Encounter for other orthopedic aftercare: Secondary | ICD-10-CM | POA: Diagnosis not present

## 2017-05-30 DIAGNOSIS — Z9889 Other specified postprocedural states: Secondary | ICD-10-CM | POA: Diagnosis not present

## 2017-06-01 DIAGNOSIS — M25552 Pain in left hip: Secondary | ICD-10-CM | POA: Diagnosis not present

## 2017-06-01 DIAGNOSIS — Z4789 Encounter for other orthopedic aftercare: Secondary | ICD-10-CM | POA: Diagnosis not present

## 2017-06-01 DIAGNOSIS — R262 Difficulty in walking, not elsewhere classified: Secondary | ICD-10-CM | POA: Diagnosis not present

## 2017-07-10 DIAGNOSIS — M25552 Pain in left hip: Secondary | ICD-10-CM | POA: Diagnosis not present

## 2017-07-24 DIAGNOSIS — M9903 Segmental and somatic dysfunction of lumbar region: Secondary | ICD-10-CM | POA: Diagnosis not present

## 2017-07-24 DIAGNOSIS — M542 Cervicalgia: Secondary | ICD-10-CM | POA: Diagnosis not present

## 2017-07-24 DIAGNOSIS — M545 Low back pain: Secondary | ICD-10-CM | POA: Diagnosis not present

## 2017-07-24 DIAGNOSIS — M546 Pain in thoracic spine: Secondary | ICD-10-CM | POA: Diagnosis not present

## 2017-08-07 DIAGNOSIS — L720 Epidermal cyst: Secondary | ICD-10-CM | POA: Diagnosis not present

## 2017-09-23 DIAGNOSIS — J0100 Acute maxillary sinusitis, unspecified: Secondary | ICD-10-CM | POA: Diagnosis not present

## 2017-09-23 DIAGNOSIS — J209 Acute bronchitis, unspecified: Secondary | ICD-10-CM | POA: Diagnosis not present

## 2017-10-06 DIAGNOSIS — M25562 Pain in left knee: Secondary | ICD-10-CM | POA: Diagnosis not present

## 2017-10-09 DIAGNOSIS — Z23 Encounter for immunization: Secondary | ICD-10-CM | POA: Diagnosis not present

## 2017-10-18 DIAGNOSIS — M542 Cervicalgia: Secondary | ICD-10-CM | POA: Diagnosis not present

## 2017-10-18 DIAGNOSIS — M546 Pain in thoracic spine: Secondary | ICD-10-CM | POA: Diagnosis not present

## 2017-10-18 DIAGNOSIS — M9903 Segmental and somatic dysfunction of lumbar region: Secondary | ICD-10-CM | POA: Diagnosis not present

## 2017-10-18 DIAGNOSIS — M9902 Segmental and somatic dysfunction of thoracic region: Secondary | ICD-10-CM | POA: Diagnosis not present

## 2017-10-19 DIAGNOSIS — J9801 Acute bronchospasm: Secondary | ICD-10-CM | POA: Diagnosis not present

## 2017-11-17 DIAGNOSIS — M25562 Pain in left knee: Secondary | ICD-10-CM | POA: Diagnosis not present

## 2017-11-22 DIAGNOSIS — M25562 Pain in left knee: Secondary | ICD-10-CM | POA: Diagnosis not present

## 2017-12-05 DIAGNOSIS — M25562 Pain in left knee: Secondary | ICD-10-CM | POA: Diagnosis not present

## 2017-12-05 DIAGNOSIS — R531 Weakness: Secondary | ICD-10-CM | POA: Diagnosis not present

## 2017-12-07 DIAGNOSIS — M25562 Pain in left knee: Secondary | ICD-10-CM | POA: Diagnosis not present

## 2017-12-07 DIAGNOSIS — R531 Weakness: Secondary | ICD-10-CM | POA: Diagnosis not present

## 2017-12-13 DIAGNOSIS — M25562 Pain in left knee: Secondary | ICD-10-CM | POA: Diagnosis not present

## 2017-12-13 DIAGNOSIS — R531 Weakness: Secondary | ICD-10-CM | POA: Diagnosis not present

## 2017-12-18 DIAGNOSIS — R531 Weakness: Secondary | ICD-10-CM | POA: Diagnosis not present

## 2017-12-18 DIAGNOSIS — M25562 Pain in left knee: Secondary | ICD-10-CM | POA: Diagnosis not present

## 2017-12-20 DIAGNOSIS — M25562 Pain in left knee: Secondary | ICD-10-CM | POA: Diagnosis not present

## 2017-12-20 DIAGNOSIS — R531 Weakness: Secondary | ICD-10-CM | POA: Diagnosis not present

## 2017-12-25 DIAGNOSIS — R531 Weakness: Secondary | ICD-10-CM | POA: Diagnosis not present

## 2017-12-25 DIAGNOSIS — M25562 Pain in left knee: Secondary | ICD-10-CM | POA: Diagnosis not present

## 2017-12-29 DIAGNOSIS — M25562 Pain in left knee: Secondary | ICD-10-CM | POA: Diagnosis not present

## 2017-12-29 DIAGNOSIS — R531 Weakness: Secondary | ICD-10-CM | POA: Diagnosis not present

## 2018-01-25 DIAGNOSIS — M9903 Segmental and somatic dysfunction of lumbar region: Secondary | ICD-10-CM | POA: Diagnosis not present

## 2018-01-25 DIAGNOSIS — M9901 Segmental and somatic dysfunction of cervical region: Secondary | ICD-10-CM | POA: Diagnosis not present

## 2018-01-25 DIAGNOSIS — M545 Low back pain: Secondary | ICD-10-CM | POA: Diagnosis not present

## 2018-01-25 DIAGNOSIS — M542 Cervicalgia: Secondary | ICD-10-CM | POA: Diagnosis not present

## 2018-02-28 ENCOUNTER — Ambulatory Visit: Payer: BLUE CROSS/BLUE SHIELD | Admitting: Family Medicine

## 2018-02-28 ENCOUNTER — Encounter: Payer: Self-pay | Admitting: Family Medicine

## 2018-02-28 ENCOUNTER — Encounter (INDEPENDENT_AMBULATORY_CARE_PROVIDER_SITE_OTHER): Payer: Self-pay

## 2018-02-28 VITALS — Temp 97.6°F | Resp 16 | Ht 65.0 in | Wt 219.0 lb

## 2018-02-28 DIAGNOSIS — R112 Nausea with vomiting, unspecified: Secondary | ICD-10-CM

## 2018-02-28 DIAGNOSIS — Z7689 Persons encountering health services in other specified circumstances: Secondary | ICD-10-CM | POA: Diagnosis not present

## 2018-02-28 DIAGNOSIS — H8110 Benign paroxysmal vertigo, unspecified ear: Secondary | ICD-10-CM | POA: Diagnosis not present

## 2018-02-28 MED ORDER — MECLIZINE HCL 25 MG PO TABS: 25.0000 mg | ORAL_TABLET | Freq: Three times a day (TID) | ORAL | 0 refills | Status: DC | PRN

## 2018-02-28 MED ORDER — ONDANSETRON 8 MG PO TBDP: 8.0000 mg | ORAL_TABLET | Freq: Three times a day (TID) | ORAL | 0 refills | Status: DC | PRN

## 2018-02-28 MED ORDER — PROMETHAZINE HCL 25 MG/ML IJ SOLN
25.0000 mg | Freq: Once | INTRAMUSCULAR | Status: AC
  Administered 2018-02-28: 25 mg via INTRAMUSCULAR

## 2018-02-28 NOTE — Progress Notes (Signed)
Subjective:    Patient ID: Curtis Adams, male    DOB: [birth year], 33 y.o.   MRN: [id number]  HPI This is a 33 yo male, accompanied by his wife, who presents today to establish care and with complaint of vertigo that started this morning. He awoke at 4 am, rolled over and noticed room spinning. Feels dizzy, nauseous. Episode has let up a little, but continues. Vomited several times, no blood. No abdominal pain. No headache. No visual changes. Recent minor head congestion. Has had similar episode in the past, approximately 4 months ago. Resolved spontaneously with sleep.   He works for the [employer] and has annual physical exams and blood work. He has had his blood work done for this year and will have his physical exam soon.   ROS- no chest pain, no SOB, no diarrhea/constipation, no muscle or joint pain.     Past Medical History:  Diagnosis Date  . Arthritis   . Complication of anesthesia    1st surgery gas was used and pt was very sick (N/V)  . Heart murmur    as a baby  . PONV (postoperative nausea and vomiting)   . Subtrochanteric fracture of hip (HCC)    Left   Past Surgical History:  Procedure Laterality Date  . EYE SURGERY     3 surgeries as a child  . FEMUR IM NAIL Left 04/06/2017   Procedure: INTRAMEDULLARY (IM) NAIL LEFT HIP RECON NAILING;  Surgeon: Marcene Corning, MD;  Location: [facility];  Service: Orthopedics;  Laterality: Left;  BIOMET/ZIMMER LEFT HIP RECON NAILING (NOT FIBULA)  . TONSILLECTOMY     and adenoidectomy   No family history on file. Social History   Tobacco Use  . Smoking status: Never Smoker  . Smokeless tobacco: Never Used  Substance Use Topics  . Alcohol use: Yes  . Drug use: No    Review of Systems Per HPI    Objective:   Physical Exam Vitals signs reviewed.  Constitutional:      General: He is not in acute distress.    Appearance: Normal appearance. He is obese. He is ill-appearing. He is not toxic-appearing or diaphoretic.  HENT:      Head: Normocephalic and atraumatic.     Right Ear: Tympanic membrane and external ear normal.     Left Ear: Tympanic membrane and external ear normal.     Ears:     Comments: Right canal with mild erythema (? From regular cotton swab insertion).     Mouth/Throat:     Mouth: Mucous membranes are moist.     Pharynx: Oropharynx is clear. No oropharyngeal exudate or posterior oropharyngeal erythema.  Eyes:     General: No visual field deficit.    Extraocular Movements: Extraocular movements intact.     Conjunctiva/sclera: Conjunctivae normal.     Pupils: Pupils are equal, round, and reactive to light.  Neck:     Musculoskeletal: Normal range of motion and neck supple. No neck rigidity or muscular tenderness.  Cardiovascular:     Rate and Rhythm: Normal rate and regular rhythm.     Heart sounds: Normal heart sounds.  Pulmonary:     Effort: Pulmonary effort is normal.     Breath sounds: Normal breath sounds.  Lymphadenopathy:     Cervical: No cervical adenopathy.  Skin:    General: Skin is warm and dry.  Neurological:     Mental Status: He is alert and oriented to person, place, and time.  Cranial Nerves: No cranial nerve deficit, dysarthria or facial asymmetry.     Motor: No weakness, tremor, atrophy, abnormal muscle tone, seizure activity or pronator drift.     Coordination: Romberg sign negative. Coordination normal. Finger-Nose-Finger Test normal.     Gait: Gait normal.     Deep Tendon Reflexes: Reflexes normal.     Reflex Scores:      Bicep reflexes are 2+ on the right side and 2+ on the left side.      Brachioradialis reflexes are 2+ on the right side and 2+ on the left side.      Patellar reflexes are 3+ on the right side and 3+ on the left side. Psychiatric:        Mood and Affect: Mood normal.        Behavior: Behavior normal.        Thought Content: Thought content normal.        Judgment: Judgment normal.          Temp 97.6 F (36.4 C) (Oral)   Resp 16    Ht 5\' 5"  (1.651 m)   Wt 219 lb (99.3 kg)   SpO2 98%   BMI 36.44 kg/m  Orthostatic VS for the past 24 hrs:  BP- Lying Pulse- Lying BP- Sitting Pulse- Sitting BP- Standing at 0 minutes Pulse- Standing at 0 minutes  02/28/18 1115 118/82 77 120/84 80 121/77 82     Assessment & Plan:  1. Encounter to establish care - Encouraged patient to send me his recent labs to put in EMR  2. Benign paroxysmal positional vertigo, unspecified laterality - Provided written and verbal information regarding diagnosis and treatment. - RTC/ER precautions reviewed - This is second episode in 4 months, if additional episodes, will consider ENT referral/ vestibular rehab - meclizine (ANTIVERT) 25 MG tablet; Take 1 tablet (25 mg total) by mouth 3 (three) times daily as needed for dizziness.  Dispense: 30 tablet; Refill: 0 - ondansetron (ZOFRAN-ODT) 8 MG disintegrating tablet; Take 1 tablet (8 mg total) by mouth every 8 (eight) hours as needed for nausea.  Dispense: 30 tablet; Refill: 0  3. Non-intractable vomiting with nausea, unspecified vomiting type - promethazine (PHENERGAN) injection 25 mg - ondansetron (ZOFRAN-ODT) 8 MG disintegrating tablet; Take 1 tablet (8 mg total) by mouth every 8 (eight) hours as needed for nausea.  Dispense: 30 tablet; Refill: 0   Olean Ree, FNP-BC  [facility], [facility]  02/28/2018 11:55 AM

## 2018-02-28 NOTE — Patient Instructions (Signed)
Good to see you today I hope you are feeling better soon I have sent antinausea medication and meclizine for dizziness If you develop new symptoms or are not better in 48 hours, please follow up Please send me copy of you lab work  Vertigo Vertigo is the feeling that you or your surroundings are moving when they are not. Vertigo can be dangerous if it occurs while you are doing something that could endanger you or others, such as driving. What are the causes? This condition is caused by a disturbance in the signals that are sent by your body's sensory systems to your brain. Different causes of a disturbance can lead to vertigo, including:  Infections, especially in the inner ear.  A bad reaction to a drug, or misuse of alcohol and medicines.  Withdrawal from drugs or alcohol.  Quickly changing positions, as when lying down or rolling over in bed.  Migraine headaches.  Decreased blood flow to the brain.  Decreased blood pressure.  Increased pressure in the brain from a head or neck injury, stroke, infection, tumor, or bleeding.  Central nervous system disorders. What are the signs or symptoms? Symptoms of this condition usually occur when you move your head or your eyes in different directions. Symptoms may start suddenly, and they usually last for less than a minute. Symptoms may include:  Loss of balance and falling.  Feeling like you are spinning or moving.  Feeling like your surroundings are spinning or moving.  Nausea and vomiting.  Blurred vision or double vision.  Difficulty hearing.  Slurred speech.  Dizziness.  Involuntary eye movement (nystagmus). Symptoms can be mild and cause only slight annoyance, or they can be severe and interfere with daily life. Episodes of vertigo may return (recur) over time, and they are often triggered by certain movements. Symptoms may improve over time. How is this diagnosed? This condition may be diagnosed based on medical  history and the quality of your nystagmus. Your health care provider may test your eye movements by asking you to quickly change positions to trigger the nystagmus. This may be called the Dix-Hallpike test, head thrust test, or roll test. You may be referred to a health care provider who specializes in ear, nose, and throat (ENT) problems (otolaryngologist) or a provider who specializes in disorders of the central nervous system (neurologist). You may have additional testing, including:  A physical exam.  Blood tests.  MRI.  A CT scan.  An electrocardiogram (ECG). This records electrical activity in your heart.  An electroencephalogram (EEG). This records electrical activity in your brain.  Hearing tests. How is this treated? Treatment for this condition depends on the cause and the severity of the symptoms. Treatment options include:  Medicines to treat nausea or vertigo. These are usually used for severe cases. Some medicines that are used to treat other conditions may also reduce or eliminate vertigo symptoms. These include: ? Medicines that control allergies (antihistamines). ? Medicines that control seizures (anticonvulsants). ? Medicines that relieve depression (antidepressants). ? Medicines that relieve anxiety (sedatives).  Head movements to adjust your inner ear back to normal. If your vertigo is caused by an ear problem, your health care provider may recommend certain movements to correct the problem.  Surgery. This is rare. Follow these instructions at home: Safety  Move slowly.Avoid sudden body or head movements.  Avoid driving.  Avoid operating heavy machinery.  Avoid doing any tasks that would cause danger to you or others if you would have a  vertigo episode during the task.  If you have trouble walking or keeping your balance, try using a cane for stability. If you feel dizzy or unstable, sit down right away.  Return to your normal activities as told by your  health care provider. Ask your health care provider what activities are safe for you. General instructions  Take over-the-counter and prescription medicines only as told by your health care provider.  Avoid certain positions or movements as told by your health care provider.  Drink enough fluid to keep your urine clear or pale yellow.  Keep all follow-up visits as told by your health care provider. This is important. Contact a health care provider if:  Your medicines do not relieve your vertigo or they make it worse.  You have a fever.  Your condition gets worse or you develop new symptoms.  Your family or friends notice any behavioral changes.  Your nausea or vomiting gets worse.  You have numbness or a "pins and needles" sensation in part of your body. Get help right away if:  You have difficulty moving or speaking.  You are always dizzy.  You faint.  You develop severe headaches.  You have weakness in your hands, arms, or legs.  You have changes in your hearing or vision.  You develop a stiff neck.  You develop sensitivity to light. This information is not intended to replace advice given to you by your health care provider. Make sure you discuss any questions you have with your health care provider. Document Released: 09/29/2004 Document Revised: 06/03/2015 Document Reviewed: 04/14/2014 Elsevier Interactive Patient Education  2019 [employer].

## 2018-03-05 ENCOUNTER — Encounter: Payer: Self-pay | Admitting: Family Medicine

## 2018-03-05 ENCOUNTER — Ambulatory Visit: Payer: BLUE CROSS/BLUE SHIELD | Admitting: Family Medicine

## 2018-03-05 ENCOUNTER — Telehealth: Payer: Self-pay | Admitting: Family Medicine

## 2018-03-05 VITALS — BP 112/84 | HR 89 | Temp 98.1°F | Resp 18 | Ht 65.0 in | Wt 220.1 lb

## 2018-03-05 DIAGNOSIS — H8110 Benign paroxysmal vertigo, unspecified ear: Secondary | ICD-10-CM | POA: Diagnosis not present

## 2018-03-05 DIAGNOSIS — R0981 Nasal congestion: Secondary | ICD-10-CM | POA: Diagnosis not present

## 2018-03-05 MED ORDER — FLUTICASONE PROPIONATE 50 MCG/ACT NA SUSP: 2.0000 | Freq: Every day | NASAL | 6 refills | Status: DC

## 2018-03-05 NOTE — Telephone Encounter (Signed)
Pt is requesting to transfer care from Curtis Adams to Dr. Para March. He said he prefers a male provider and his parents Gery Pray and Tesfa Obriant are patients of Dr Para March. Pt established care with Curtis Adams 02/28/18- called for a same day appt and was advised Dr Para March is no longer taking new patients. OK to transfer?

## 2018-03-05 NOTE — Progress Notes (Addendum)
Subjective:    Patient ID: Curtis Adams, male    DOB: [birth year], 33 y.o.   MRN: [id number]   HPI   This is a 33 yo male being seen today for follow-up of vertigo symptoms. Improvement of symptoms with Meclizine but symptoms remain of "swimmy headedness" Feels as though eyes don't want to focus when vertigo occurs, symptoms worse in upon waking in the morning, wanes during day, reoccurs in evening. 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  Denies any nausea/vomiting today. Currently feels pressure in ears like they need to "pop" R>L pressure, sinus congestion.    Past Medical History:  Diagnosis Date  . Arthritis   . Complication of anesthesia    1st surgery gas was used and pt was very sick (N/V)  . Heart murmur    as a baby  . PONV (postoperative nausea and vomiting)   . Subtrochanteric fracture of hip (HCC)    Left   Past Surgical History:  Procedure Laterality Date  . EYE SURGERY     3 surgeries as a child  . FEMUR IM NAIL Left 04/06/2017   Procedure: INTRAMEDULLARY (IM) NAIL LEFT HIP RECON NAILING;  Surgeon: Marcene Corning, MD;  Location: [facility] OR;  Service: Orthopedics;  Laterality: Left;  BIOMET/ZIMMER LEFT HIP RECON NAILING (NOT FIBULA)  . TONSILLECTOMY     and adenoidectomy   No family history on file. Social History   Tobacco Use  . Smoking status: Never Smoker  . Smokeless tobacco: Never Used  Substance Use Topics  . Alcohol use: Yes  . Drug use: No      Review of Systems Per HPI    Objective:   Physical Exam Vitals signs reviewed.  Constitutional:      Appearance: Normal appearance.  HENT:     Head: Normocephalic and atraumatic.     Right Ear: Tympanic membrane normal.     Left Ear: Tympanic membrane normal.     Nose: Nose normal.     Mouth/Throat:     Mouth: Mucous membranes are moist.  Cardiovascular:     Rate and Rhythm: Normal rate and regular rhythm.     Heart sounds: Normal heart sounds.  Pulmonary:   Effort: Pulmonary effort is normal.  Skin:    General: Skin is warm and dry.  Neurological:     Mental Status: He is alert and oriented to person, place, and time.     Comments: Reports improved dizziness with interventions given; still not totally relieved.  Psychiatric:        Mood and Affect: Mood normal.        Behavior: Behavior normal.     BP 112/84   Pulse 89   Temp 98.1 F (36.7 C) (Oral)   Resp 18   Ht 5\' 5"  (1.651 m)   Wt 220 lb 1.9 oz (99.8 kg)   SpO2 98%   BMI 36.63 kg/m   BP Readings from Last 3 Encounters:  03/05/18 112/84  04/06/17 (!) 107/59  07/28/15 124/86   Wt Readings from Last 3 Encounters:  03/05/18 220 lb 1.9 oz (99.8 kg)  02/28/18 219 lb (99.3 kg)  04/06/17 212 lb (96.2 kg)          Assessment & Plan:  Nasal congestion - Plan: fluticasone (FLONASE) 50 MCG/ACT nasal spray  Patient Instructions  Start fluticasone nasal spray  One meclizine at bedtime, see if other symptoms improve  Update me via Mychart at end of  the week if not significantly improved

## 2018-03-05 NOTE — Progress Notes (Signed)
Subjective:    Patient ID: Curtis Adams, male    DOB: [birth year], 33 y.o.   MRN: [id number]  HPI This is a 33 yo male who presents today with continued vertigo symptoms. He was seen 02/28/18 (5 days ago) with a severe episode of vertigo, nausea and vomiting. He was given phenergan IM in the office, ondansetron and meclizine for use at home. He has been taking the meclizine three times a day and has taken one dose of ondansetron. Episodes have significantly improved with the worst being the initial episode. Episodes most bothersome upon awakening. Has been able to work without difficulty. Feels like his eyes are jittery and difficulty focusing. Some pressure in right ear, feels like it needs to pop. Mild nasal congestion started recently, not enough to take any medication.  No additional symptoms, no headache, no blurred or double vision.   Past Medical History:  Diagnosis Date  . Arthritis   . Complication of anesthesia    1st surgery gas was used and pt was very sick (N/V)  . Heart murmur    as a baby  . PONV (postoperative nausea and vomiting)   . Subtrochanteric fracture of hip (HCC)    Left   Past Surgical History:  Procedure Laterality Date  . EYE SURGERY     3 surgeries as a child  . FEMUR IM NAIL Left 04/06/2017   Procedure: INTRAMEDULLARY (IM) NAIL LEFT HIP RECON NAILING;  Surgeon: Marcene Corning, MD;  Location: [facility] OR;  Service: Orthopedics;  Laterality: Left;  BIOMET/ZIMMER LEFT HIP RECON NAILING (NOT FIBULA)  . TONSILLECTOMY     and adenoidectomy   No family history on file. Social History   Tobacco Use  . Smoking status: Never Smoker  . Smokeless tobacco: Never Used  Substance Use Topics  . Alcohol use: Yes  . Drug use: No      Review of Systems Per HPI    Objective:   Physical Exam Vitals signs reviewed.  Constitutional:      General: He is not in acute distress.    Appearance: Normal appearance. He is obese. He is not ill-appearing, toxic-appearing or  diaphoretic.  HENT:     Head: Normocephalic and atraumatic.     Right Ear: Tympanic membrane, ear canal and external ear normal.     Left Ear: Tympanic membrane, ear canal and external ear normal.     Nose: Congestion present.     Comments: Sounds mildly congested.  Neck:     Musculoskeletal: Normal range of motion and neck supple.  Cardiovascular:     Rate and Rhythm: Normal rate.  Pulmonary:     Effort: Pulmonary effort is normal.  Skin:    General: Skin is warm and dry.  Neurological:     Mental Status: He is alert and oriented to person, place, and time.  Psychiatric:        Mood and Affect: Mood normal.        Behavior: Behavior normal.        Thought Content: Thought content normal.        Judgment: Judgment normal.       BP 112/84   Pulse 89   Temp 98.1 F (36.7 C) (Oral)   Resp 18   Ht 5\' 5"  (1.651 m)   Wt 220 lb 1.9 oz (99.8 kg)   SpO2 98%   BMI 36.63 kg/m  Wt Readings from Last 3 Encounters:  03/05/18 220 lb 1.9 oz (99.8 kg)  02/28/18 219 lb (99.3 kg)  04/06/17 212 lb (96.2 kg)       Assessment & Plan:  1. Benign paroxysmal positional vertigo, unspecified laterality - significantly improved over last 5 days, discussed expectations of improvement and can take several weeks for symptoms to completely resolve - will have him decrease meclizine to bedtime as taking during the day may be causing him to feel sedated and have difficulty concentrating - RTC/ER precautions reviewed - I have asked him to update me about his condition by the end of the week if not significantly improve and I can refer him to ENT  2. Nasal congestion - fluticasone (FLONASE) 50 MCG/ACT nasal spray; Place 2 sprays into both nostrils daily.  Dispense: 16 g; Refill: 6   Olean Ree, FNP-BC  [facility], [facility]  03/06/2018 8:58 PM

## 2018-03-05 NOTE — Patient Instructions (Addendum)
Start fluticasone nasal spray  One meclizine at bedtime, see if other symptoms improve  Update me via Mychart at end of the week if not significantly improved

## 2018-03-06 ENCOUNTER — Encounter: Payer: Self-pay | Admitting: Family Medicine

## 2018-03-06 NOTE — Telephone Encounter (Signed)
Okay with me 

## 2018-03-06 NOTE — Telephone Encounter (Signed)
Please change the PCP to me and I would schedule next CPE with me later on.  That doesn't have to be done right now, esp since he has labs done via his work.  I'll await his labs from work.  Update me as needed.  Thanks.

## 2018-03-06 NOTE — Telephone Encounter (Signed)
That is fine with me.

## 2018-03-08 NOTE — Telephone Encounter (Signed)
I left msg on home phone for pt to return my call. Chart is updated with Dr Para March as pcp- please sch pt for CPE at pt's convenience.

## 2018-03-19 ENCOUNTER — Encounter: Payer: Self-pay | Admitting: Physician Assistant

## 2018-03-19 ENCOUNTER — Telehealth: Payer: BLUE CROSS/BLUE SHIELD | Admitting: Physician Assistant

## 2018-03-19 DIAGNOSIS — R42 Dizziness and giddiness: Secondary | ICD-10-CM

## 2018-03-19 NOTE — Progress Notes (Signed)
Based on what you shared with me, I feel your condition warrants further evaluation and I recommend that you be seen for a face to face office visit.   [name],  I recommend following up with your primary care provider or the provider that is currently treating your for your vertigo. The medications you are currently on is the same that we would have provided. Sorry your are not feeling well, and hope you feel better soon!   NOTE: If you entered your credit card information for this eVisit, you will not be charged. You may see a "hold" on your card for the $35 but that hold will drop off and you will not have a charge processed.  If you are having a true medical emergency please call 911.  If you need an urgent face to face visit, [place] has four urgent care centers for your convenience.  If you need care fast and have a high deductible or no insurance consider:   [email or web address] to reserve your spot online an avoid wait times  [facility] [address] [place], [place] [address] 8 am to 8 pm Monday-Friday 10 am to 4 pm Saturday-Sunday *Across the street from [employer]  [address] [place], [address] 8 am to 5 pm Monday-Friday * In the [facility] on the [facility]   The following sites will take your insurance:  . [facility]  [phone] Get Driving Directions Find a Provider at this Location  [address] [place], [place] [address] . 10 am to 8 pm Monday-Friday . 12 pm to 8 pm Saturday-Sunday   . [facility] at [facility]  [phone] Get Driving Directions Find a Provider at this Location  [address] [place], [place] [address] . 8 am to 8 pm Monday-Friday . 9 am to 6 pm Saturday . 11 am to 6 pm Sunday   . [facility] at [facility] : [facility]  [phone] Get Driving Directions  [address]  [address] [place], [place] [address] . 8 am to 8 pm Monday-Friday . 8 am to 4 pm Saturday-Sunday   Your e-visit answers were reviewed by a board certified advanced clinical practitioner to complete your personal care plan.  Thank you for using e-Visits.

## 2018-04-03 ENCOUNTER — Encounter: Payer: BLUE CROSS/BLUE SHIELD | Admitting: Family Medicine

## 2018-06-18 ENCOUNTER — Encounter: Payer: BLUE CROSS/BLUE SHIELD | Admitting: Family Medicine

## 2018-07-08 IMAGING — RF DG HIP (WITH PELVIS) OPERATIVE*L*
1 series · 5 of 5 positions shown · non-contrast
Comparison: No prior.

CLINICAL DATA: ORIF left femur.

EXAM:
DG C-ARM 61-120 MIN; OPERATIVE LEFT HIP WITH PELVIS

[Series 1: run · 5 of 5 slices shown]
[im 1/5]
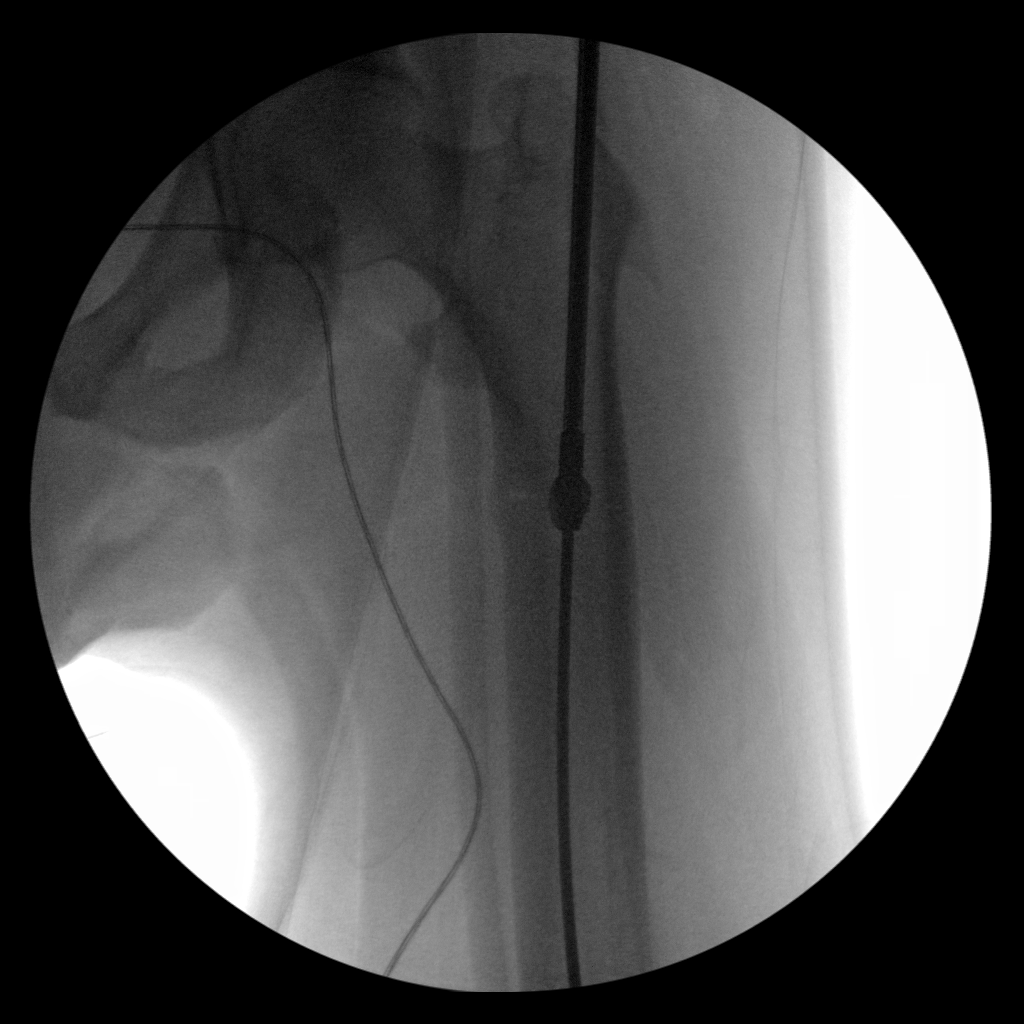
[im 2/5]
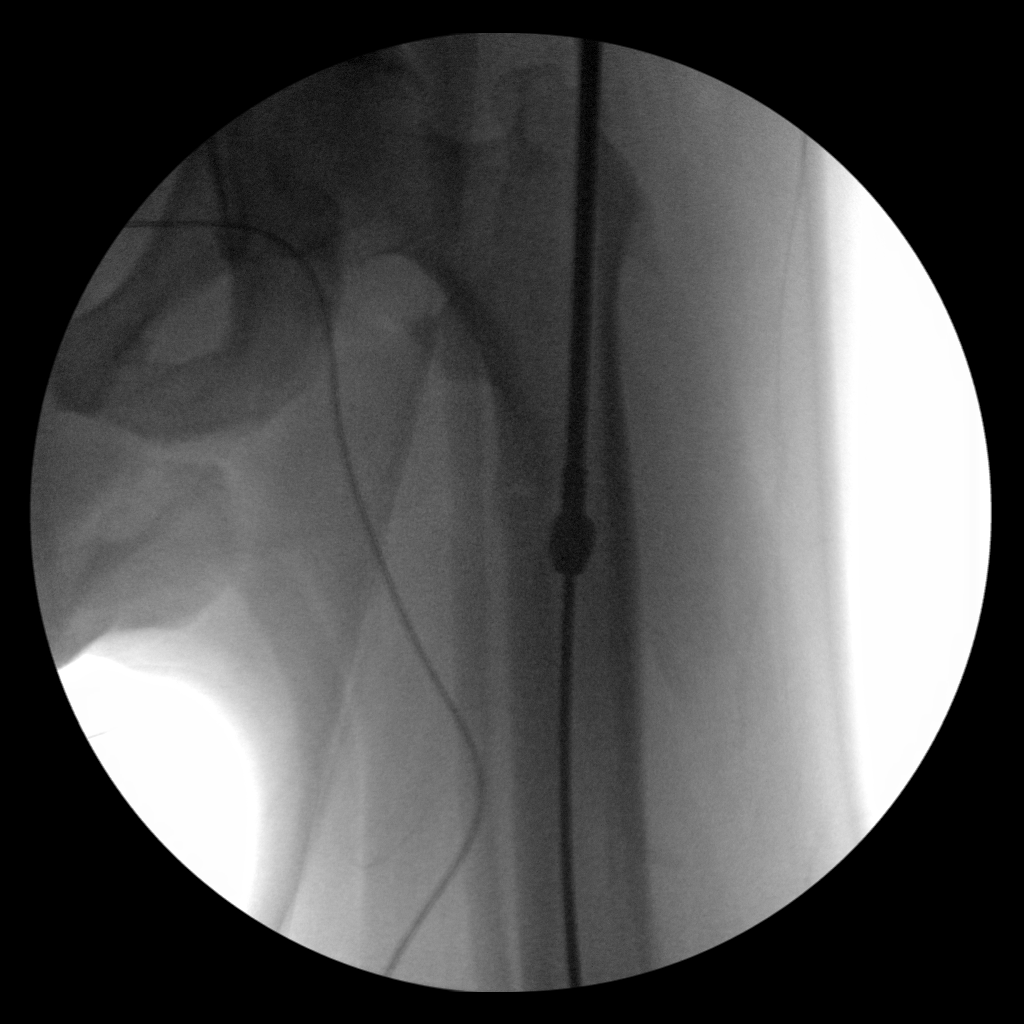
[im 3/5]
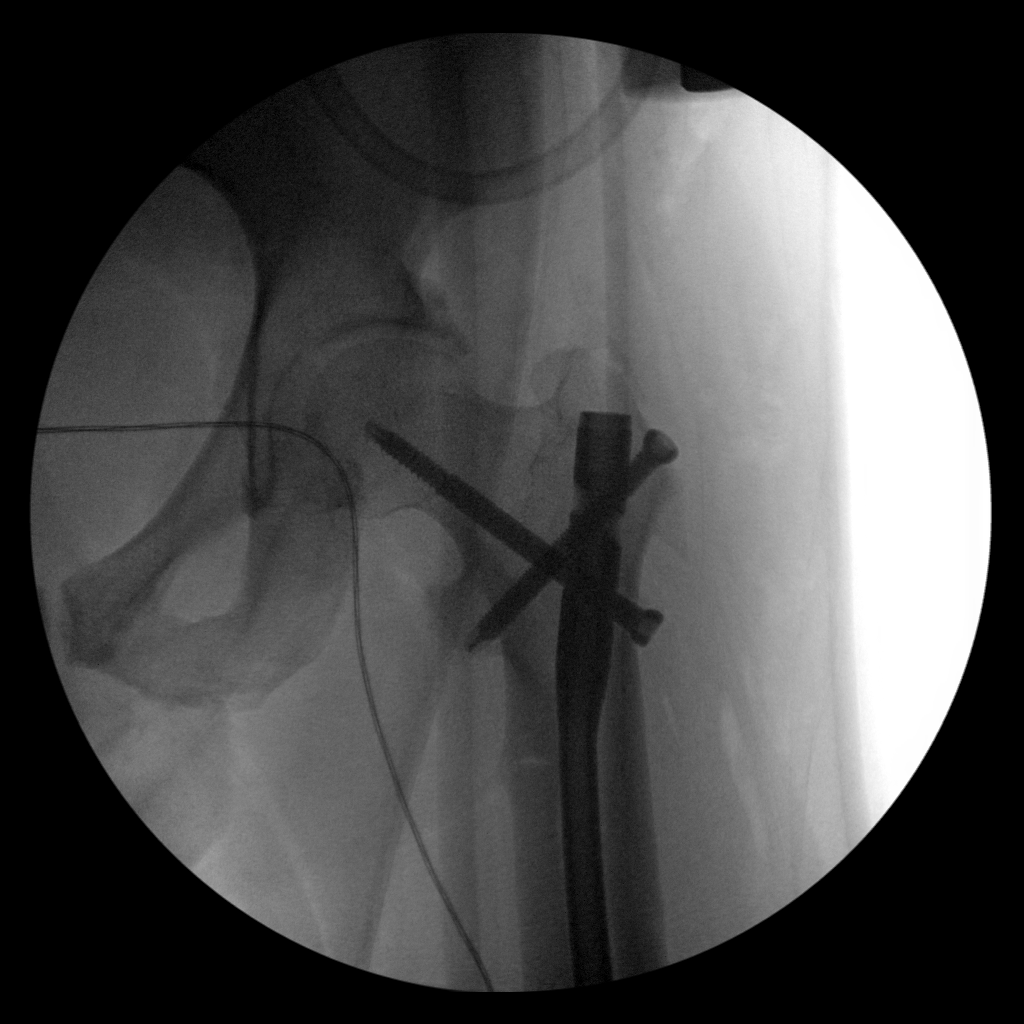
[im 4/5]
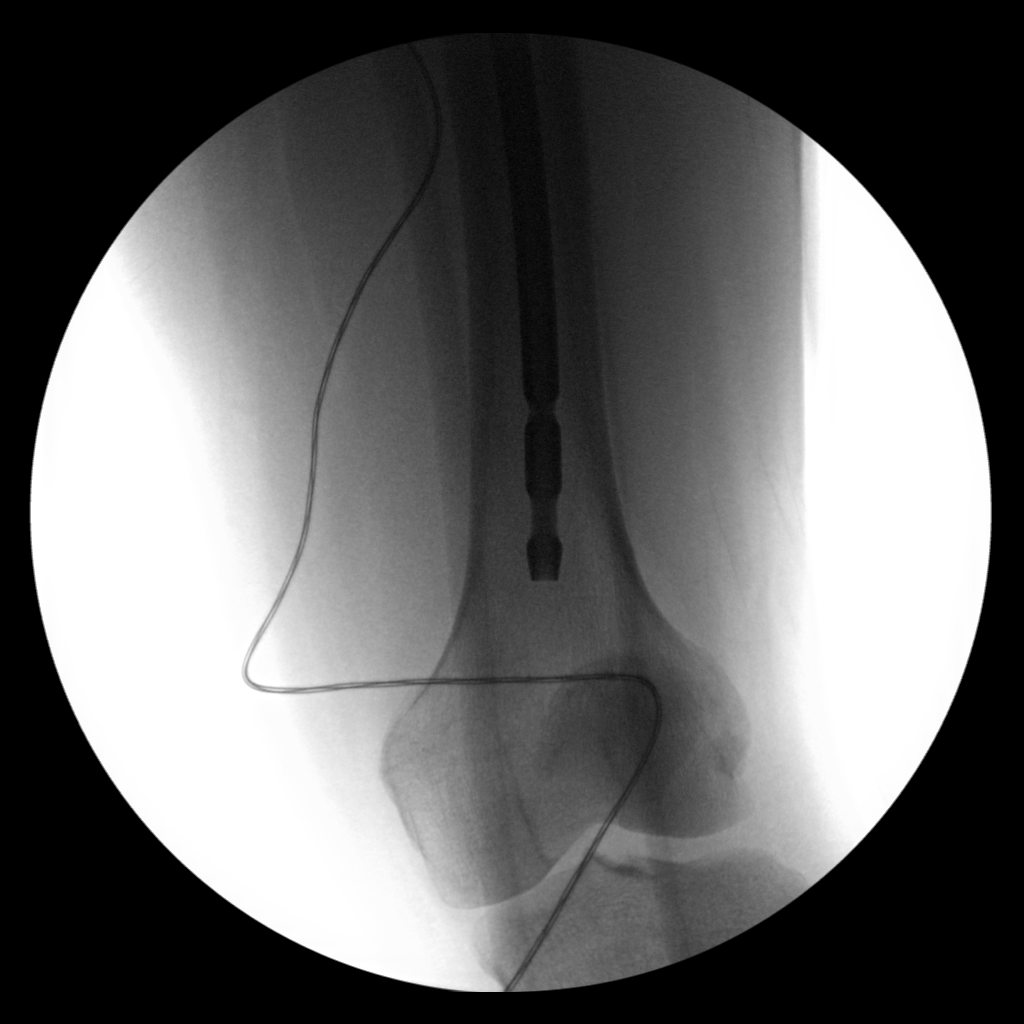
[im 5/5]
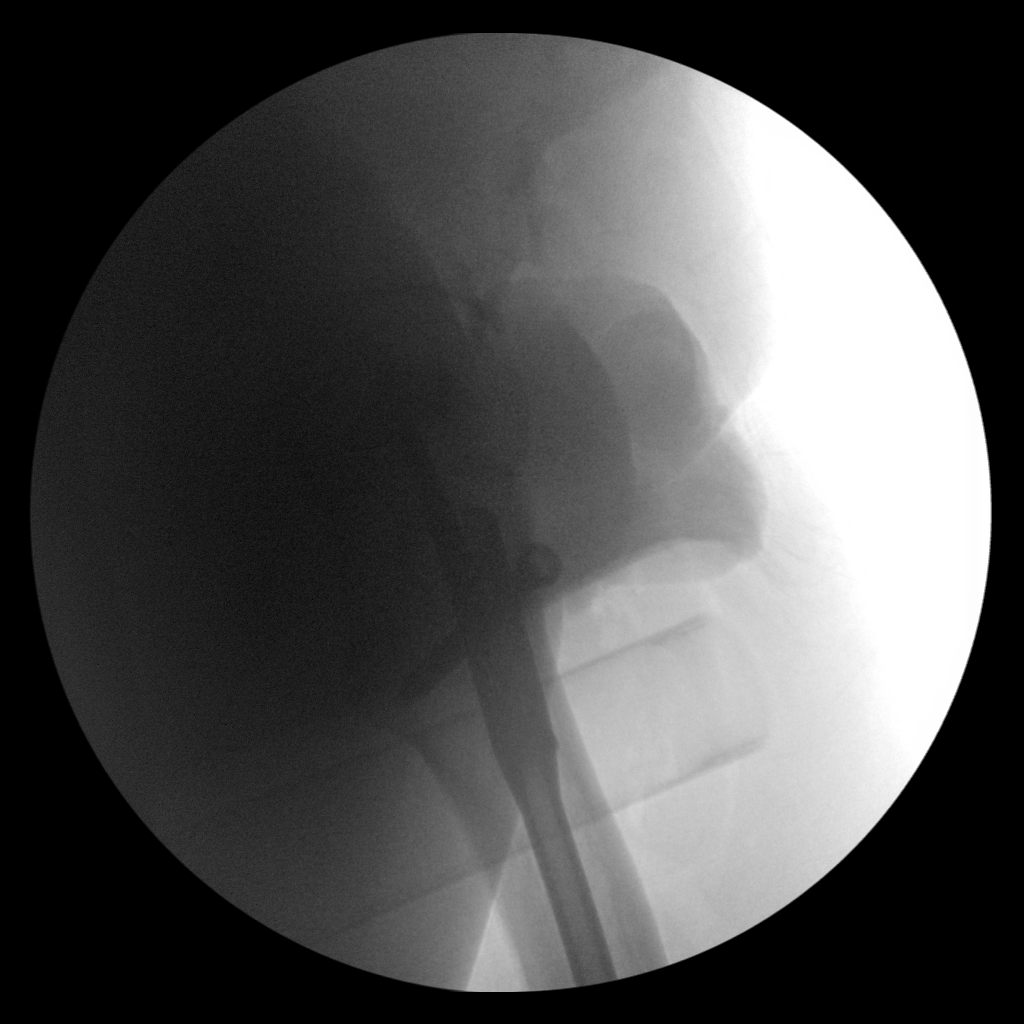

[5 of 5 positions shown; findings below may reference images not displayed]

FINDINGS: ORIF left femur. Hardware intact. Anatomic alignment. Fluoroscopic
time 1 minutes 13 seconds. Five images obtained.
IMPRESSION: ORIF left femur with anatomic alignment.

## 2018-07-26 DIAGNOSIS — M9902 Segmental and somatic dysfunction of thoracic region: Secondary | ICD-10-CM | POA: Diagnosis not present

## 2018-07-26 DIAGNOSIS — M546 Pain in thoracic spine: Secondary | ICD-10-CM | POA: Diagnosis not present

## 2018-07-26 DIAGNOSIS — M9903 Segmental and somatic dysfunction of lumbar region: Secondary | ICD-10-CM | POA: Diagnosis not present

## 2018-07-26 DIAGNOSIS — M545 Low back pain: Secondary | ICD-10-CM | POA: Diagnosis not present

## 2018-08-03 ENCOUNTER — Other Ambulatory Visit: Payer: Self-pay

## 2018-08-03 ENCOUNTER — Telehealth: Payer: Self-pay

## 2018-08-03 ENCOUNTER — Ambulatory Visit (INDEPENDENT_AMBULATORY_CARE_PROVIDER_SITE_OTHER): Payer: BC Managed Care – PPO | Admitting: Family Medicine

## 2018-08-03 ENCOUNTER — Encounter: Payer: Self-pay | Admitting: Family Medicine

## 2018-08-03 DIAGNOSIS — H9209 Otalgia, unspecified ear: Secondary | ICD-10-CM | POA: Diagnosis not present

## 2018-08-03 MED ORDER — AMOXICILLIN-POT CLAVULANATE 875-125 MG PO TABS: 1.0000 | ORAL_TABLET | Freq: Two times a day (BID) | ORAL | 0 refills | Status: DC

## 2018-08-03 NOTE — Progress Notes (Signed)
Virtual visit completed through WebEx or similar program Patient location: home  Provider location: Financial controller at [facility], office   Pandemic considerations d/w pt.   Limitations and rationale for visit method d/w patient.  Patient agreed to proceed.   CC: ear pain  HPI: started 2 days ago. Right ear.    He is at [facility], [place].    He had h/o vertigo in the fall of 2019.  He was asking about ENT f/u at some point.  He has had 3 bouts of vertigo in the last year or so. Now with return of sx.  Minimal pressure L ear but clearly more pain in the R ear.  No post nasal gtt.  No vertigo now.    We talked about referral to ENT.  He is considering when he wants to go, depending on pandemic.  He'll update me if he needs a referral.    No FCNAVD.  No ear canal drainage.  He has been swimming in the ocean.  He doesn't have pain with showering.  He took advil for pain, it possibly helped a little.  He has flonase but hasn't used recently.   Meds and allergies reviewed.   ROS: Per HPI unless specifically indicated in ROS section   NAD Speech wnl R pinna not ttp to manipulation.    A/P: Discussed options.  He could have eustachian tube dysfunction or he could have acute otitis media.  Either way he still okay for outpatient follow-up.  In the meantime I would use flonase 2 sprays per nostril per day for now and start augmentin if sx persist.  I can't check his TM at this point but given his location and pandemic, we agreed this was reasonable.    He'll update me if he needs a referral re: vertigo.    He was going to get his physical done at work and he'll update me with those records.  We'll cancel his upcoming CPE here.

## 2018-08-03 NOTE — Telephone Encounter (Signed)
[place] Night - Client Nonclinical Telephone Record AccessNurse Client [place] [facility] Night - Client Client [place] [facility] [place] Physician Renford Dills - MD Contact Type Call Who Is Calling Patient / Member / Family / Caregiver Caller Name Arthur Phone Number [phone] Patient Name Curtis Adams Patient DOB Oct 28, 1985 Call Type Message Only Information Provided Reason for Call Request for General Office Information Initial Comment Caller states he thought his 8am appt was a virtual appt but My Chart says it's an office visit, he would like a call to know which one it is. Additional Comment Call Closed By: [place] Transaction Date/Time: 08/03/2018 7:34:48 AM (ET)

## 2018-08-03 NOTE — Telephone Encounter (Signed)
Per pts chart virtual visit already started.

## 2018-08-06 DIAGNOSIS — H9209 Otalgia, unspecified ear: Secondary | ICD-10-CM | POA: Insufficient documentation

## 2018-08-06 NOTE — Assessment & Plan Note (Signed)
Discussed options.  He could have eustachian tube dysfunction or he could have acute otitis media.  Either way he still okay for outpatient follow-up.  In the meantime I would use flonase 2 sprays per nostril per day for now and start augmentin if sx persist.  I can't check his TM at this point but given his location and pandemic, we agreed this was reasonable.    He'll update me if he needs a referral re: vertigo.    He was going to get his physical done at work and he'll update me with those records.  We'll cancel his upcoming CPE here.

## 2018-08-13 ENCOUNTER — Encounter: Payer: BLUE CROSS/BLUE SHIELD | Admitting: Family Medicine

## 2018-08-15 DIAGNOSIS — H9042 Sensorineural hearing loss, unilateral, left ear, with unrestricted hearing on the contralateral side: Secondary | ICD-10-CM | POA: Diagnosis not present

## 2018-08-15 DIAGNOSIS — R42 Dizziness and giddiness: Secondary | ICD-10-CM | POA: Diagnosis not present

## 2018-09-24 ENCOUNTER — Other Ambulatory Visit: Payer: Self-pay

## 2018-09-24 ENCOUNTER — Ambulatory Visit (INDEPENDENT_AMBULATORY_CARE_PROVIDER_SITE_OTHER): Payer: BC Managed Care – PPO | Admitting: Family Medicine

## 2018-09-24 ENCOUNTER — Encounter: Payer: Self-pay | Admitting: Family Medicine

## 2018-09-24 VITALS — HR 84 | Temp 98.9°F | Ht 65.0 in | Wt 215.0 lb

## 2018-09-24 DIAGNOSIS — R509 Fever, unspecified: Secondary | ICD-10-CM | POA: Diagnosis not present

## 2018-09-24 DIAGNOSIS — Z20822 Contact with and (suspected) exposure to covid-19: Secondary | ICD-10-CM

## 2018-09-24 DIAGNOSIS — R6889 Other general symptoms and signs: Secondary | ICD-10-CM | POA: Diagnosis not present

## 2018-09-24 DIAGNOSIS — R197 Diarrhea, unspecified: Secondary | ICD-10-CM | POA: Diagnosis not present

## 2018-09-24 DIAGNOSIS — R070 Pain in throat: Secondary | ICD-10-CM | POA: Diagnosis not present

## 2018-09-24 NOTE — Progress Notes (Signed)
     Curtis Zurawski T. Anuoluwapo Mefferd, MD [facility] [place] at [facility] [place], [address] Phone: [phone]  FAX: [place] - 33 y.o. male  MRN [id number]  Date of Birth: 01-29-1985  Visit Date: 09/24/2018  PCP: Tonia Ghent, MD  Referred by: Tonia Ghent, MD Chief Complaint  Patient presents with  . Headache  . Diarrhea  . Scratchy Throat  . Fever  . Cough    occasionally-with chest buring when laying down   Virtual Visit via Video Note:  I connected with  MONTOYA BRANDEL on 09/24/2018 10:40 AM EDT by a video enabled telemedicine application and verified that I am speaking with the correct person using two identifiers.   Location patient: home computer, tablet, or smartphone Location provider: work or home office Consent: Verbal consent directly obtained from Verdis Prime. Persons participating in the virtual visit: patient, provider  I discussed the limitations of evaluation and management by telemedicine and the availability of in person appointments. The patient expressed understanding and agreed to proceed.  History of Present Illness:  He has been sick for about 24 hours with a fever to 100.5 degrees, and is also had diarrhea as well as a scratchy throat.  He denies nausea, vomiting.  No shortness of breath or significant sputum production.  He does have a runny nose.  No significant sinus pain or earache.  No new rashes or neurological symptoms.  Review of Systems as above: See pertinent positives and pertinent negatives per HPI No acute distress verbally  Past Medical History, Surgical History, Social History, Family History, Problem List, Medications, and Allergies have been reviewed and updated if relevant.   Observations/Objective/Exam:  An attempt was made to discern vital signs over the phone and per patient if applicable and possible.   General:    Alert, Oriented,  appears well and in no acute distress HEENT:     Atraumatic, conjunctiva clear, no obvious abnormalities on inspection of external nose and ears.  Neck:    Normal movements of the head and neck Pulmonary:     On inspection no signs of respiratory distress, breathing rate appears normal, no obvious gross SOB, gasping or wheezing Cardiovascular:    No obvious cyanosis Musculoskeletal:    Moves all visible extremities without noticeable abnormality Psych / Neurological:     Pleasant and cooperative, no obvious depression or anxiety, speech and thought processing grossly intact  Assessment and Plan:    ICD-10-CM   1. Fever, unknown origin  R50.9   2. Diarrhea, unspecified type  R19.7   3. Throat pain in adult  R07.0    Basic supportive care, cannot exclude COVID-19, so we will send him for testing.  I discussed the assessment and treatment plan with the patient. The patient was provided an opportunity to ask questions and all were answered. The patient agreed with the plan and demonstrated an understanding of the instructions.   The patient was advised to call back or seek an in-person evaluation if the symptoms worsen or if the condition fails to improve as anticipated.  Follow-up: prn unless noted otherwise below No follow-ups on file.  No orders of the defined types were placed in this encounter.  No orders of the defined types were placed in this encounter.   Signed,  Maud Deed. Willadeen Colantuono, MD

## 2018-09-26 LAB — NOVEL CORONAVIRUS, NAA: SARS-CoV-2, NAA: NOT DETECTED

## 2018-10-01 ENCOUNTER — Telehealth: Payer: Self-pay | Admitting: Family Medicine

## 2018-10-01 ENCOUNTER — Other Ambulatory Visit: Payer: Self-pay

## 2018-10-01 ENCOUNTER — Ambulatory Visit (INDEPENDENT_AMBULATORY_CARE_PROVIDER_SITE_OTHER): Payer: BC Managed Care – PPO | Admitting: Family Medicine

## 2018-10-01 DIAGNOSIS — Z20822 Contact with and (suspected) exposure to covid-19: Secondary | ICD-10-CM

## 2018-10-01 DIAGNOSIS — J069 Acute upper respiratory infection, unspecified: Secondary | ICD-10-CM | POA: Diagnosis not present

## 2018-10-01 NOTE — Progress Notes (Signed)
Interactive audio and video telecommunications were attempted between this provider and patient, however failed, due to patient having technical difficulties OR patient did not have access to video capability.  We continued and completed visit with audio only.   Virtual Visit via Telephone Note  I connected with patient on 10/01/18  at 10:48 AM  by telephone and verified that I am speaking with the correct person using two identifiers.  Location of patient: home  Location of MD: [place] Name of referring provider (if blank then none associated): Names per persons and role in encounter:  MD: Earlyne Iba, Patient: name listed above.    I discussed the limitations, risks, security and privacy concerns of performing an evaluation and management service by telephone and the availability of in person appointments. I also discussed with the patient that there may be a patient responsible charge related to this service. The patient expressed understanding and agreed to proceed.  CC: fever.   History of Present Illness:  1 week ago with fever, ST, runny nose, HA, diarrhea. Tested neg for covid in the meantime, negative per patient report.  Fever resolved for 2 days, then mild temp elevation in the meantime, up to ~99.  Still with some diarrhea, slight nasal congestion. Some throat irritation.  Fatigued.  He feels some better than prev, but not back to baseline.   He is out of work in the meantime.  No CP, not SOB.  Noted dec in taste the last few days but better today.  No facial pain.     Observations/Objective: nad Speech wnl  Assessment and Plan: URI sx.  He feels some better than prev, but not back to baseline.  Out of work for now, would recheck covid test.  Supportive care.   He agrees. Update me as needed. Okay for outpatient follow-up.  Follow Up Instructions: see above.    I discussed the assessment and treatment plan with the patient. The patient was provided an  opportunity to ask questions and all were answered. The patient agreed with the plan and demonstrated an understanding of the instructions.   The patient was advised to call back or seek an in-person evaluation if the symptoms worsen or if the condition fails to improve as anticipated.  I provided 15 minutes of non-face-to-face time during this encounter.  Elsie Stain, MD

## 2018-10-01 NOTE — Telephone Encounter (Signed)
Please call pt about getting repeat covid testing set up.

## 2018-10-01 NOTE — Telephone Encounter (Signed)
Done

## 2018-10-02 LAB — NOVEL CORONAVIRUS, NAA: SARS-CoV-2, NAA: DETECTED — AB

## 2018-10-03 ENCOUNTER — Ambulatory Visit: Payer: Self-pay

## 2018-10-03 NOTE — Telephone Encounter (Signed)
Please check on patient.  Please give him routine quarantine and COVID cautions.  Please add him to the follow-up phone list.  Have him update [place] as needed.  Thanks.

## 2018-10-03 NOTE — Telephone Encounter (Signed)
Patient called today and stated that he tested positive for covid when he retested. He would like to know what he should do next. he wanted to know if you want him to follow up with you

## 2018-10-03 NOTE — Telephone Encounter (Signed)
Incoming call from Patient Provided  Covid testing results to Patient .  Provided care advice  Answered questions.

## 2018-10-04 ENCOUNTER — Encounter: Payer: Self-pay | Admitting: Family Medicine

## 2018-10-04 DIAGNOSIS — J069 Acute upper respiratory infection, unspecified: Secondary | ICD-10-CM | POA: Insufficient documentation

## 2018-10-04 NOTE — Telephone Encounter (Signed)
Patient advised.

## 2018-10-04 NOTE — Assessment & Plan Note (Signed)
URI sx.  He feels some better than prev, but not back to baseline.  Out of work for now, would recheck covid test.  Supportive care.   He agrees. Update me as needed. Okay for outpatient follow-up.

## 2018-10-08 ENCOUNTER — Telehealth: Payer: Self-pay | Admitting: Family Medicine

## 2018-10-08 NOTE — Telephone Encounter (Signed)
Patient called.  Patient tested positive on 10/01/18. Patient started having symptoms on 09/24/18.  Patient said he hasn't had a fever since last Thursday.  Patient said he doesn't have any covid symptoms.  Patient wants to know when he can come out of quarantine. Patient's family is in quarantine, also, with no symptoms.  Patient needs a note to return to work.

## 2018-10-09 ENCOUNTER — Encounter: Payer: Self-pay | Admitting: Family Medicine

## 2018-10-09 NOTE — Telephone Encounter (Signed)
The guidelines for quarantine are:     10 days since symptoms first appeared and     24 hours with no fever without the use of fever-reducing medications and     Other symptoms of COVID-19 are improving.   At this point he should be done with his quarantine.  Please see when his next work shift is scheduled and give him a note to return to work on that day.  Thanks.

## 2018-10-09 NOTE — Telephone Encounter (Signed)
Please give him a note for the 13th given the fatigue and his line of work.  Thanks.

## 2018-10-09 NOTE — Telephone Encounter (Signed)
Ok to give note 

## 2018-10-09 NOTE — Telephone Encounter (Signed)
Pt aware of dr Josefine Class comment.  Pt stated he is feeling better but still has low energy.  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

## 2018-10-09 NOTE — Telephone Encounter (Signed)
Pt advised. Letter placed up front for pick up.

## 2018-10-12 NOTE — Telephone Encounter (Signed)
Called to check on patient today. Patient states he is doing better, energy level is improving some. No new symptoms or concerns. Patient will updated [place] as needed.

## 2018-10-17 ENCOUNTER — Encounter: Payer: Self-pay | Admitting: Family Medicine

## 2018-10-17 ENCOUNTER — Ambulatory Visit (INDEPENDENT_AMBULATORY_CARE_PROVIDER_SITE_OTHER): Payer: BC Managed Care – PPO | Admitting: Family Medicine

## 2018-10-17 DIAGNOSIS — J309 Allergic rhinitis, unspecified: Secondary | ICD-10-CM | POA: Insufficient documentation

## 2018-10-17 DIAGNOSIS — J301 Allergic rhinitis due to pollen: Secondary | ICD-10-CM

## 2018-10-17 NOTE — Patient Instructions (Signed)
Continue flonase Add on zyrtec 10 mg otc once daily in the evening   Alert [place] if sinus pain, fever or colored nasal drainage Also if worse or not improving over the course of the next week

## 2018-10-17 NOTE — Progress Notes (Signed)
Virtual Visit via Video Note  I connected with Curtis Adams on 10/17/18 at  9:00 AM EDT by a video enabled telemedicine application and verified that I am speaking with the correct person using two identifiers.  Location: Patient: home Provider: office    I discussed the limitations of evaluation and management by telemedicine and the availability of in person appointments. The patient expressed understanding and agreed to proceed.  History of Present Illness: Pt presents with nasal congestion   Diagnosed with covid 19  On 9/28  Symptom free for over 10 days except for nasal congestion  -did very well  Got better Went to the beach/fished   Now has some pnd/phlegm /congestion and deep sinus congestion  Worried about a possible sinus infection  Some pressure in am -not pain - in cheeks - both sides equal   Blows nose in am- clear mucous  Ears- now are fine Throat- is not sore/just feels drainage and has to clear throat Only coughs to clear throat -infrequent   No phlegm from chest  No wheezing   No fever at all  Taste and smell are back normal   Going on a kayak trip soon   No medicines otc   He may have some fall allergies  He uses flonase  Has considered an allergy referral  -would like to in the future to get tested   Patient Active Problem List   Diagnosis Date Noted  . Allergic rhinitis 10/17/2018  . URI (upper respiratory infection) 10/04/2018  . Ear pain 08/06/2018   Past Medical History:  Diagnosis Date  . Arthritis   . Complication of anesthesia    1st surgery gas was used and pt was very sick (N/V)  . Heart murmur    as a baby  . PONV (postoperative nausea and vomiting)   . Subtrochanteric fracture of hip (HCC)    Left   Past Surgical History:  Procedure Laterality Date  . EYE SURGERY     3 surgeries as a child  . FEMUR IM NAIL Left 04/06/2017   Procedure: INTRAMEDULLARY (IM) NAIL LEFT HIP RECON NAILING;  Surgeon: Marcene Corning, MD;   Location: [facility] OR;  Service: Orthopedics;  Laterality: Left;  BIOMET/ZIMMER LEFT HIP RECON NAILING (NOT FIBULA)  . TONSILLECTOMY     and adenoidectomy   Social History   Tobacco Use  . Smoking status: Never Smoker  . Smokeless tobacco: Never Used  Substance Use Topics  . Alcohol use: Yes  . Drug use: No   History reviewed. No pertinent family history. No Known Allergies Current Outpatient Medications on File Prior to Visit  Medication Sig Dispense Refill  . fluticasone (FLONASE) 50 MCG/ACT nasal spray Place 2 sprays into both nostrils daily as needed for allergies or rhinitis.     No current facility-administered medications on file prior to visit.    Review of Systems  Constitutional: Negative for chills, diaphoresis, fever and malaise/fatigue.  HENT: Positive for congestion. Negative for ear discharge, ear pain, sinus pain and sore throat.   Eyes: Negative for discharge and redness.  Respiratory: Negative for cough, sputum production, shortness of breath and stridor.        Clears throat frequently  Cardiovascular: Negative for chest pain and palpitations.  Gastrointestinal: Negative for abdominal pain, diarrhea, heartburn, nausea and vomiting.  Skin: Negative for rash.  Neurological: Negative for dizziness and headaches.    Observations/Objective: Patient appears well, in no distress Weight is baseline  No facial swelling or  asymmetry Normal voice-not hoarse and no slurred speech No obvious tremor or mobility impairment Moving neck and UEs normally Able to hear the call well  No cough or shortness of breath during interview  Talkative and mentally sharp with no cognitive changes No skin changes on face or neck , no rash or pallor Affect is normal    Assessment and Plan: Problem List Items Addressed This Visit      Respiratory   Allergic rhinitis    Pt has pnd /throat clearing   (s/p covid over 3 weeks ago) Some sinus fullness/no pain and all mucous is clear Using  flonase Suggested add on zyrtec  Avoid allergens if possible (he is considering allergy testing as well)  Inst to watch for sinus pain/headache, purulent mucous, cough, fever or worsening rhinorrhea and let us know  Update if not starting to improve in a week or if worsening            Follow Up Instructions:   Continue flonase Add on zyrtec 10 mg otc once daily in the evening   Alert [place] if sinus pain, fever or colored nasal drainage Also if worse or not improving over the course of the next week  I discussed the assessment and treatment plan with the patient. The patient was provided an opportunity to ask questions and all were answered. The patient agreed with the plan and demonstrated an understanding of the instructions.   The patient was advised to call back or seek an in-person evaluation if the symptoms worsen or if the condition fails to improve as anticipated.     Loura Pardon, MD

## 2018-10-17 NOTE — Assessment & Plan Note (Signed)
Pt has pnd /throat clearing   (s/p covid over 3 weeks ago) Some sinus fullness/no pain and all mucous is clear Using flonase Suggested add on zyrtec  Avoid allergens if possible (he is considering allergy testing as well)  Inst to watch for sinus pain/headache, purulent mucous, cough, fever or worsening rhinorrhea and let us know  Update if not starting to improve in a week or if worsening

## 2018-10-18 DIAGNOSIS — M546 Pain in thoracic spine: Secondary | ICD-10-CM | POA: Diagnosis not present

## 2018-10-18 DIAGNOSIS — M9902 Segmental and somatic dysfunction of thoracic region: Secondary | ICD-10-CM | POA: Diagnosis not present

## 2018-10-18 DIAGNOSIS — M9903 Segmental and somatic dysfunction of lumbar region: Secondary | ICD-10-CM | POA: Diagnosis not present

## 2018-10-18 DIAGNOSIS — M545 Low back pain: Secondary | ICD-10-CM | POA: Diagnosis not present

## 2018-11-01 ENCOUNTER — Ambulatory Visit (INDEPENDENT_AMBULATORY_CARE_PROVIDER_SITE_OTHER): Payer: BC Managed Care – PPO

## 2018-11-01 DIAGNOSIS — Z23 Encounter for immunization: Secondary | ICD-10-CM

## 2018-11-05 DIAGNOSIS — J3089 Other allergic rhinitis: Secondary | ICD-10-CM | POA: Diagnosis not present

## 2018-11-05 DIAGNOSIS — J3081 Allergic rhinitis due to animal (cat) (dog) hair and dander: Secondary | ICD-10-CM | POA: Diagnosis not present

## 2019-01-14 ENCOUNTER — Telehealth: Payer: Self-pay

## 2019-01-14 NOTE — Telephone Encounter (Signed)
It maybe theoretically possible but not likely.  I think it makes sense to get the f/u testing done prior to return to work, just to be in the safe side.  Thanks.

## 2019-01-14 NOTE — Telephone Encounter (Signed)
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 Patient states he is having some minor head congestion - but this is nothing unusual as he has seasonal allergies.  Patient states he may be required to have a COVID test before he can return to work, but patient is wondering if he can get COVID again so soon since he just had it in Sep? Please advise.

## 2019-01-14 NOTE — Telephone Encounter (Signed)
Patient advised. Patient is scheduled for Wednesday to be tested.

## 2019-01-16 ENCOUNTER — Ambulatory Visit: Payer: BC Managed Care – PPO | Attending: Internal Medicine

## 2019-01-16 DIAGNOSIS — Z20822 Contact with and (suspected) exposure to covid-19: Secondary | ICD-10-CM | POA: Diagnosis not present

## 2019-01-17 LAB — NOVEL CORONAVIRUS, NAA: SARS-CoV-2, NAA: NOT DETECTED

## 2019-05-24 DIAGNOSIS — J3089 Other allergic rhinitis: Secondary | ICD-10-CM | POA: Diagnosis not present

## 2019-05-24 DIAGNOSIS — J3081 Allergic rhinitis due to animal (cat) (dog) hair and dander: Secondary | ICD-10-CM | POA: Diagnosis not present

## 2019-06-11 ENCOUNTER — Ambulatory Visit: Payer: BC Managed Care – PPO | Admitting: Family Medicine

## 2019-06-11 ENCOUNTER — Other Ambulatory Visit: Payer: Self-pay

## 2019-06-11 ENCOUNTER — Encounter: Payer: Self-pay | Admitting: Family Medicine

## 2019-06-11 VITALS — BP 112/82 | HR 75 | Temp 97.6°F | Ht 65.0 in | Wt 223.1 lb

## 2019-06-11 DIAGNOSIS — R0683 Snoring: Secondary | ICD-10-CM | POA: Diagnosis not present

## 2019-06-11 NOTE — Patient Instructions (Addendum)
Please send me a copy of you labs and EKG via mychart.   We'll call about seeing pulmonary.  Take care.  Glad to see you.

## 2019-06-11 NOTE — Progress Notes (Signed)
This visit occurred during the SARS-CoV-2 public health emergency.  Safety protocols were in place, including screening questions prior to the visit, additional usage of staff PPE, and extensive cleaning of exam room while observing appropriate contact time as indicated for disinfecting solutions.  Wife noted him snoring.  D/w pt about options.  He was familiar with sleep apnea.  Some occ AM HA, unclear if that is from allergies and post nasal gtt.  Some fatigue.  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  He can nap watching TV at night.  He doesn't nap in the day.  He is trying to maintain his sleep schedule.  No high risk events such as falling asleep at a stoplight.  Meds, vitals, and allergies reviewed.   ROS: Per HPI unless specifically indicated in ROS section   nad ncat 17.5" neck.  Neck supple, no lymphadenopathy rrr ctab Ext w/o edema

## 2019-06-12 DIAGNOSIS — R0683 Snoring: Secondary | ICD-10-CM | POA: Insufficient documentation

## 2019-06-12 NOTE — Assessment & Plan Note (Signed)
He has known snoring.  He has a 17.5 inch neck.  He could have sleep apnea.  We talked about sleep apnea pathophysiology and routine testing for that.  Refer to pulmonary.  I hope they will be able to get him set up with a home sleep study evaluation.  He agreed.  He will update me as needed.  Routine cautions given to patient.

## 2019-07-31 ENCOUNTER — Other Ambulatory Visit: Payer: Self-pay

## 2019-07-31 ENCOUNTER — Ambulatory Visit (INDEPENDENT_AMBULATORY_CARE_PROVIDER_SITE_OTHER): Payer: BC Managed Care – PPO | Admitting: Pulmonary Disease

## 2019-07-31 ENCOUNTER — Encounter: Payer: Self-pay | Admitting: Pulmonary Disease

## 2019-07-31 VITALS — BP 134/80 | HR 86 | Temp 98.6°F | Resp 20 | Ht 65.0 in | Wt 217.0 lb

## 2019-07-31 DIAGNOSIS — G4733 Obstructive sleep apnea (adult) (pediatric): Secondary | ICD-10-CM

## 2019-07-31 NOTE — Patient Instructions (Signed)
Moderate probability of significant obstructive sleep apnea  We will schedule you for home sleep study We will update you with results as soon as reviewed  Treatment options as we discussed  Follow-up in 3 months Sleep Apnea Sleep apnea is a condition in which breathing pauses or becomes shallow during sleep. Episodes of sleep apnea usually last 10 seconds or longer, and they may occur as many as 20 times an hour. Sleep apnea disrupts your sleep and keeps your body from getting the rest that it needs. This condition can increase your risk of certain health problems, including:  Heart attack.  Stroke.  Obesity.  Diabetes.  Heart failure.  Irregular heartbeat. What are the causes? There are three kinds of sleep apnea:  Obstructive sleep apnea. This kind is caused by a blocked or collapsed airway.  Central sleep apnea. This kind happens when the part of the brain that controls breathing does not send the correct signals to the muscles that control breathing.  Mixed sleep apnea. This is a combination of obstructive and central sleep apnea. The most common cause of this condition is a collapsed or blocked airway. An airway can collapse or become blocked if:  Your throat muscles are abnormally relaxed.  Your tongue and tonsils are larger than normal.  You are overweight.  Your airway is smaller than normal. What increases the risk? You are more likely to develop this condition if you:  Are overweight.  Smoke.  Have a smaller than normal airway.  Are elderly.  Are male.  Drink alcohol.  Take sedatives or tranquilizers.  Have a family history of sleep apnea. What are the signs or symptoms? Symptoms of this condition include:  Trouble staying asleep.  Daytime sleepiness and tiredness.  Irritability.  Loud snoring.  Morning headaches.  Trouble concentrating.  Forgetfulness.  Decreased interest in sex.  Unexplained sleepiness.  Mood  swings.  Personality changes.  Feelings of depression.  Waking up often during the night to urinate.  Dry mouth.  Sore throat. How is this diagnosed? This condition may be diagnosed with:  A medical history.  A physical exam.  A series of tests that are done while you are sleeping (sleep study). These tests are usually done in a sleep lab, but they may also be done at home. How is this treated? Treatment for this condition aims to restore normal breathing and to ease symptoms during sleep. It may involve managing health issues that can affect breathing, such as high blood pressure or obesity. Treatment may include:  Sleeping on your side.  Using a decongestant if you have nasal congestion.  Avoiding the use of depressants, including alcohol, sedatives, and narcotics.  Losing weight if you are overweight.  Making changes to your diet.  Quitting smoking.  Using a device to open your airway while you sleep, such as: ? An oral appliance. This is a custom-made mouthpiece that shifts your lower jaw forward. ? A continuous positive airway pressure (CPAP) device. This device blows air through a mask when you breathe out (exhale). ? A nasal expiratory positive airway pressure (EPAP) device. This device has valves that you put into each nostril. ? A bi-level positive airway pressure (BPAP) device. This device blows air through a mask when you breathe in (inhale) and breathe out (exhale).  Having surgery if other treatments do not work. During surgery, excess tissue is removed to create a wider airway. It is important to get treatment for sleep apnea. Without treatment, this condition can  lead to:  High blood pressure.  Coronary artery disease.  In men, an inability to achieve or maintain an erection (impotence).  Reduced thinking abilities. Follow these instructions at home: Lifestyle  Make any lifestyle changes that your health care provider recommends.  Eat a healthy,  well-balanced diet.  Take steps to lose weight if you are overweight.  Avoid using depressants, including alcohol, sedatives, and narcotics.  Do not use any products that contain nicotine or tobacco, such as cigarettes, e-cigarettes, and chewing tobacco. If you need help quitting, ask your health care provider. General instructions  Take over-the-counter and prescription medicines only as told by your health care provider.  If you were given a device to open your airway while you sleep, use it only as told by your health care provider.  If you are having surgery, make sure to tell your health care provider you have sleep apnea. You may need to bring your device with you.  Keep all follow-up visits as told by your health care provider. This is important. Contact a health care provider if:  The device that you received to open your airway during sleep is uncomfortable or does not seem to be working.  Your symptoms do not improve.  Your symptoms get worse. Get help right away if:  You develop: ? Chest pain. ? Shortness of breath. ? Discomfort in your back, arms, or stomach.  You have: ? Trouble speaking. ? Weakness on one side of your body. ? Drooping in your face. These symptoms may represent a serious problem that is an emergency. Do not wait to see if the symptoms will go away. Get medical help right away. Call your local emergency services (911 in the [place] Do not drive yourself to the hospital. Summary  Sleep apnea is a condition in which breathing pauses or becomes shallow during sleep.  The most common cause is a collapsed or blocked airway.  The goal of treatment is to restore normal breathing and to ease symptoms during sleep. This information is not intended to replace advice given to you by your health care provider. Make sure you discuss any questions you have with your health care provider. Document Revised: 06/06/2018 Document Reviewed: 08/15/2017 Elsevier  Patient Education  [place].

## 2019-07-31 NOTE — Progress Notes (Signed)
Curtis Adams    [id number]    Aug 27, 1985  Primary Care Physician:Duncan, Dwana Curd, MD  Referring Physician: Joaquim Nam, MD [address] [place],  [place] [address]  Chief complaint:   Patient with a history of snoring  HPI:  Snoring getting significantly louder but according to spouse Denies significant daytime symptoms Wakes up feeling like he is at a good nights rest the most days  Usually goes to bed about 930 to 10:30 PM Takes him about 5 minutes or less to fall asleep 3 or more awakenings Usual wake up time between 5 AM and 6:30 AM  Has been managed to roll over a few times at night No witnessed apneas Occasional dryness of the mouth in the morning Occasional headaches in the morning which he relates to allergies His dad has obstructive sleep apnea  Never smoker  Memory is good Focus is good   Outpatient Encounter Medications as of 07/31/2019  Medication Sig  . fluticasone (FLONASE) 50 MCG/ACT nasal spray Place 2 sprays into both nostrils daily as needed for allergies or rhinitis.  Marland Kitchen levocetirizine (XYZAL) 5 MG tablet Take 5 mg by mouth every evening.   No facility-administered encounter medications on file as of 07/31/2019.    Allergies as of 07/31/2019 - Review Complete 07/31/2019  Allergen Reaction Noted  . Other  06/11/2019    Past Medical History:  Diagnosis Date  . Arthritis   . Complication of anesthesia    1st surgery gas was used and pt was very sick (N/V)  . Heart murmur    as a baby  . PONV (postoperative nausea and vomiting)   . Subtrochanteric fracture of hip (HCC)    Left    Past Surgical History:  Procedure Laterality Date  . EYE SURGERY     3 surgeries as a child  . FEMUR IM NAIL Left 04/06/2017   Procedure: INTRAMEDULLARY (IM) NAIL LEFT HIP RECON NAILING;  Surgeon: Marcene Corning, MD;  Location: [facility] OR;  Service: Orthopedics;  Laterality: Left;  BIOMET/ZIMMER LEFT HIP RECON NAILING (NOT FIBULA)  .  TONSILLECTOMY     and adenoidectomy    No family history on file.  Social History   Socioeconomic History  . Marital status: Married    Spouse name: Toni Amend  . Number of children: Not on file  . Years of education: Not on file  . Highest education level: Some college, no degree  Occupational History  . Occupation: firefighter    Comment: Sports coach  Tobacco Use  . Smoking status: Never Smoker  . Smokeless tobacco: Never Used  Vaping Use  . Vaping Use: Never used  Substance and Sexual Activity  . Alcohol use: Yes  . Drug use: No  . Sexual activity: Yes  Other Topics Concern  . Not on file  Social History Narrative  . Not on file   Social Determinants of Health   Financial Resource Strain:   . Difficulty of Paying Living Expenses:   Food Insecurity:   . Worried About Programme researcher, broadcasting/film/video in the Last Year:   . Barista in the Last Year:   Transportation Needs:   . Freight forwarder (Medical):   Marland Kitchen Lack of Transportation (Non-Medical):   Physical Activity:   . Days of Exercise per Week:   . Minutes of Exercise per Session:   Stress:   . Feeling of Stress :  Social Connections:   . Frequency of Communication with Friends and Family:   . Frequency of Social Gatherings with Friends and Family:   . Attends Religious Services:   . Active Member of Clubs or Organizations:   . Attends Banker Meetings:   Marland Kitchen Marital Status:   Intimate Partner Violence:   . Fear of Current or Ex-Partner:   . Emotionally Abused:   Marland Kitchen Physically Abused:   . Sexually Abused:     Review of Systems  Psychiatric/Behavioral: Positive for sleep disturbance.    Vitals:   07/31/19 1503  BP: (!) 134/80  Pulse: 86  Resp: 20  Temp: 98.6 F (37 C)  SpO2: 99%     Physical Exam Constitutional:      Appearance: He is obese.  HENT:     Head: Normocephalic.     Nose: Nose normal.     Mouth/Throat:     Mouth: Mucous membranes are moist.      Comments: Crowded oropharynx, Mallampati 4 Eyes:     General:        Right eye: No discharge.        Left eye: No discharge.     Pupils: Pupils are equal, round, and reactive to light.  Cardiovascular:     Rate and Rhythm: Normal rate and regular rhythm.     Pulses: Normal pulses.     Heart sounds: Normal heart sounds. No murmur heard.   Pulmonary:     Effort: Pulmonary effort is normal. No respiratory distress.     Breath sounds: Normal breath sounds. No stridor. No wheezing or rhonchi.  Musculoskeletal:     Cervical back: No rigidity or tenderness.  Skin:    General: Skin is warm.  Neurological:     General: No focal deficit present.     Mental Status: He is alert.  Psychiatric:        Mood and Affect: Mood normal.    Results of the Epworth flowsheet 07/31/2019  Sitting and reading 1  Watching TV 3  Sitting, inactive in a public place (e.g. a theatre or a meeting) 0  As a passenger in a car for an hour without a break 0  Lying down to rest in the afternoon when circumstances permit 2  Sitting and talking to someone 0  Sitting quietly after a lunch without alcohol 1  In a car, while stopped for a few minutes in traffic 0  Total score 7   Assessment:  Moderate probability of significant obstructive sleep apnea  Daytime sleepiness  Obesity  Pathophysiology of sleep disordered breathing discussed with the patient Treatment options for sleep disordered breathing discussed with the patient  Plan/Recommendations: Risk of not treating sleep disordered breathing discussed  We will schedule the patient for home sleep study  Follow-up in 3 months  Virl Diamond MD [facility] 07/31/2019, 3:29 PM  CC: Joaquim Nam, MD

## 2019-08-15 DIAGNOSIS — M545 Low back pain: Secondary | ICD-10-CM | POA: Diagnosis not present

## 2019-08-15 DIAGNOSIS — M9905 Segmental and somatic dysfunction of pelvic region: Secondary | ICD-10-CM | POA: Diagnosis not present

## 2019-08-15 DIAGNOSIS — M9903 Segmental and somatic dysfunction of lumbar region: Secondary | ICD-10-CM | POA: Diagnosis not present

## 2019-08-15 DIAGNOSIS — M9904 Segmental and somatic dysfunction of sacral region: Secondary | ICD-10-CM | POA: Diagnosis not present

## 2019-08-29 DIAGNOSIS — L0100 Impetigo, unspecified: Secondary | ICD-10-CM | POA: Diagnosis not present

## 2019-08-29 DIAGNOSIS — L0889 Other specified local infections of the skin and subcutaneous tissue: Secondary | ICD-10-CM | POA: Diagnosis not present

## 2019-08-29 DIAGNOSIS — S2096XA Insect bite (nonvenomous) of unspecified parts of thorax, initial encounter: Secondary | ICD-10-CM | POA: Diagnosis not present

## 2019-09-18 ENCOUNTER — Other Ambulatory Visit: Payer: Self-pay

## 2019-09-18 ENCOUNTER — Ambulatory Visit: Payer: BC Managed Care – PPO

## 2019-09-18 DIAGNOSIS — G4733 Obstructive sleep apnea (adult) (pediatric): Secondary | ICD-10-CM

## 2019-09-19 DIAGNOSIS — L0231 Cutaneous abscess of buttock: Secondary | ICD-10-CM | POA: Diagnosis not present

## 2019-09-19 DIAGNOSIS — L720 Epidermal cyst: Secondary | ICD-10-CM | POA: Diagnosis not present

## 2019-09-24 ENCOUNTER — Telehealth: Payer: Self-pay | Admitting: Pulmonary Disease

## 2019-09-24 DIAGNOSIS — G4733 Obstructive sleep apnea (adult) (pediatric): Secondary | ICD-10-CM | POA: Diagnosis not present

## 2019-09-24 NOTE — Telephone Encounter (Signed)
Contacted patient with results of home sleep study. Patient agrees to start CPAP therapy. DME order sent, follow up recall in. Patient verbalized understanding of results and ongoing plan of care.

## 2019-09-24 NOTE — Telephone Encounter (Signed)
Call patient  Sleep study result  Date of study: 09/18/2019  Impression: Moderate obstructive sleep apnea Mild oxygen desaturations  Recommendation: DME referral  Recommend CPAP therapy for moderate obstructive sleep apnea  Auto titrating CPAP with pressure settings of 5-15 will be appropriate  Encourage weight loss measures  Follow-up in the office 4 to 6 weeks following initiation of treatment

## 2019-10-01 DIAGNOSIS — M9905 Segmental and somatic dysfunction of pelvic region: Secondary | ICD-10-CM | POA: Diagnosis not present

## 2019-10-01 DIAGNOSIS — M545 Low back pain: Secondary | ICD-10-CM | POA: Diagnosis not present

## 2019-10-01 DIAGNOSIS — M9903 Segmental and somatic dysfunction of lumbar region: Secondary | ICD-10-CM | POA: Diagnosis not present

## 2019-10-01 DIAGNOSIS — M9904 Segmental and somatic dysfunction of sacral region: Secondary | ICD-10-CM | POA: Diagnosis not present

## 2020-02-12 ENCOUNTER — Telehealth: Payer: Self-pay | Admitting: Pulmonary Disease

## 2020-02-12 NOTE — Telephone Encounter (Signed)
I am okay with providing the patient with the LUNA machine

## 2020-02-12 NOTE — Telephone Encounter (Signed)
Spoke with Curtis Adams. She is aware that AO is ok with the patient receiving a Luna machine. She verbalized understanding.   Nothing further needed at time of call.

## 2020-02-12 NOTE — Telephone Encounter (Signed)
AO please advise if you are ok if we order the LUNA machine for the pt.  Thanks

## 2020-03-20 ENCOUNTER — Encounter: Payer: Self-pay | Admitting: Family Medicine

## 2020-03-20 ENCOUNTER — Other Ambulatory Visit: Payer: Self-pay

## 2020-03-20 ENCOUNTER — Ambulatory Visit: Payer: Managed Care, Other (non HMO) | Admitting: Family Medicine

## 2020-03-20 VITALS — BP 112/78 | HR 76 | Temp 98.7°F | Ht 65.0 in | Wt 221.2 lb

## 2020-03-20 DIAGNOSIS — N50811 Right testicular pain: Secondary | ICD-10-CM | POA: Diagnosis not present

## 2020-03-20 DIAGNOSIS — N50812 Left testicular pain: Secondary | ICD-10-CM

## 2020-03-20 NOTE — Patient Instructions (Signed)
Wear supportive underwear.  We will set up non-urgent scrotal ultrasound.

## 2020-03-20 NOTE — Progress Notes (Signed)
    Patient ID: Curtis Adams, male    DOB: 05/29/1985, 35 y.o.   MRN: [id number]  This visit was conducted in person.  BP 112/78   Pulse 76   Temp 98.7 F (37.1 C) (Temporal)   Ht 5\' 5"  (1.651 m)   Wt 221 lb 4 oz (100.4 kg)   SpO2 98%   BMI 36.82 kg/m    CC:  Chief Complaint  Patient presents with  . Testicle Pain    Dull Ache    Subjective:   HPI: Curtis Adams is a 35 y.o. male patient of D.r Duncan's   presenting on 03/20/2020 for Testicle Pain (Dull Ache)  He reports in the last week... he has noted a pressure, dull ache in bilateral testicles. Possible radiation of pain to bladder area.  He feels that in last few days it has been worse. Feels like a pulling down feeling.  No change in activity.  No rash, no swelling , no redness.   No past testicular issues. Family history of testicular cancer.  Work in 03/22/2020.  No dysuria, no blood in urine.  No ejaculation change.  No  penile discharge. No new sexually contacts.          Relevant past medical, surgical, family and social history reviewed and updated as indicated. Interim medical history since our last visit reviewed. Allergies and medications reviewed and updated. Outpatient Medications Prior to Visit  Medication Sig Dispense Refill  . levocetirizine (XYZAL) 5 MG tablet Take 5 mg by mouth every evening.    . fluticasone (FLONASE) 50 MCG/ACT nasal spray Place 2 sprays into both nostrils daily as needed for allergies or rhinitis.     No facility-administered medications prior to visit.     Per HPI unless specifically indicated in ROS section below Review of Systems Objective:  BP 112/78   Pulse 76   Temp 98.7 F (37.1 C) (Temporal)   Ht 5\' 5"  (1.651 m)   Wt 221 lb 4 oz (100.4 kg)   SpO2 98%   BMI 36.82 kg/m   Wt Readings from Last 3 Encounters:  03/20/20 221 lb 4 oz (100.4 kg)  07/31/19 (!) 217 lb (98.4 kg)  06/11/19 223 lb 1 oz (101.2 kg)      Physical Exam    Results for  orders placed or performed in visit on 01/16/19  Novel Coronavirus, NAA ([facility])   Specimen: Oropharyngeal(OP) collection in vial transport medium   OROPHARYNGEA  TESTING  Result Value Ref Range   SARS-CoV-2, NAA Not Detected Not Detected    This visit occurred during the SARS-CoV-2 public health emergency.  Safety protocols were in place, including screening questions prior to the visit, additional usage of staff PPE, and extensive cleaning of exam room while observing appropriate contact time as indicated for disinfecting solutions.   COVID 19 screen:  No recent travel or known exposure to COVID19 The patient denies respiratory symptoms of COVID 19 at this time. The importance of social distancing was discussed today.   Assessment and Plan     08/11/19, MD

## 2020-03-23 ENCOUNTER — Telehealth: Payer: Self-pay | Admitting: Family Medicine

## 2020-03-23 NOTE — Telephone Encounter (Signed)
Curtis Adams from [facility] and wanted to know if the Ultrasound of the Scrotum if its with doppler or without. If it is with doppler than it would need to be changed in Epic

## 2020-03-24 ENCOUNTER — Other Ambulatory Visit: Payer: Self-pay | Admitting: Family Medicine

## 2020-03-24 DIAGNOSIS — N50811 Right testicular pain: Secondary | ICD-10-CM

## 2020-03-24 DIAGNOSIS — N50812 Left testicular pain: Secondary | ICD-10-CM

## 2020-03-24 NOTE — Telephone Encounter (Signed)
Will change to with doppler.

## 2020-04-06 ENCOUNTER — Telehealth: Payer: Self-pay | Admitting: Pulmonary Disease

## 2020-04-06 NOTE — Telephone Encounter (Signed)
Called and spoke with Patient. Patient requested cpap settings and apnea events from his sleep study. Patient cpap was set up 04/03/20 with [facility]. Current cpap settings are auto 5-15. Copy of home sleep study mailed to Patient for his records. Nothing further at this time.

## 2020-05-07 ENCOUNTER — Encounter: Payer: Self-pay | Admitting: Pulmonary Disease

## 2020-05-07 ENCOUNTER — Ambulatory Visit: Payer: Managed Care, Other (non HMO) | Admitting: Pulmonary Disease

## 2020-05-07 ENCOUNTER — Other Ambulatory Visit: Payer: Self-pay

## 2020-05-07 VITALS — BP 122/78 | HR 78 | Temp 97.3°F | Ht 65.0 in | Wt 221.6 lb

## 2020-05-07 DIAGNOSIS — G4733 Obstructive sleep apnea (adult) (pediatric): Secondary | ICD-10-CM

## 2020-05-07 DIAGNOSIS — Z9989 Dependence on other enabling machines and devices: Secondary | ICD-10-CM | POA: Diagnosis not present

## 2020-05-07 NOTE — Progress Notes (Signed)
Curtis Adams    [id number]    [birth year]  Primary Care Physician:Duncan, Dwana Curd, MD  Referring Physician: Joaquim Nam, MD [address] [place],  [place] [address]  Chief complaint:   Patient with a history of snoring Found to have moderate obstructive sleep apnea, has been on CPAP therapy HPI:  Energy levels are better Daytime symptoms are improving  Using CPAP nightly with no significant concerns  Sleep habits have not changed Wakes up feeling like is at a good nights rest  Usually goes to bed about 930 to 10:30 PM Takes him about 5 minutes or less to fall asleep 3 or more awakenings Usual wake up time between 5 AM and 6:30 AM  He does feel CPAP is helping him No headaches  His dad has obstructive sleep apnea  Never smoker  Memory is good Focus is good   Outpatient Encounter Medications as of 05/07/2020  Medication Sig  . fluticasone (FLONASE SENSIMIST) 27.5 MCG/SPRAY nasal spray 2 sprays each nostril  . levocetirizine (XYZAL) 5 MG tablet Take 5 mg by mouth every evening.  . Olopatadine HCl 0.6 % SOLN 1-2 sprays in each nostril  . Olopatadine HCl 0.6 % SOLN Place into both nostrils.   No facility-administered encounter medications on file as of 05/07/2020.    Allergies as of 05/07/2020 - Review Complete 05/07/2020  Allergen Reaction Noted  . Other  06/11/2019    Past Medical History:  Diagnosis Date  . Arthritis   . Complication of anesthesia    1st surgery gas was used and pt was very sick (N/V)  . Heart murmur    as a baby  . PONV (postoperative nausea and vomiting)   . Subtrochanteric fracture of hip (HCC)    Left    Past Surgical History:  Procedure Laterality Date  . EYE SURGERY     3 surgeries as a child  . FEMUR IM NAIL Left 04/06/2017   Procedure: INTRAMEDULLARY (IM) NAIL LEFT HIP RECON NAILING;  Surgeon: Marcene Corning, MD;  Location: [facility] OR;  Service: Orthopedics;  Laterality: Left;  BIOMET/ZIMMER LEFT HIP RECON  NAILING (NOT FIBULA)  . TONSILLECTOMY     and adenoidectomy    History reviewed. No pertinent family history.  Social History   Socioeconomic History  . Marital status: Married    Spouse name: Toni Amend  . Number of children: Not on file  . Years of education: Not on file  . Highest education level: Some college, no degree  Occupational History  . Occupation: firefighter    Comment: Sports coach  Tobacco Use  . Smoking status: Never Smoker  . Smokeless tobacco: Never Used  Vaping Use  . Vaping Use: Never used  Substance and Sexual Activity  . Alcohol use: Yes  . Drug use: No  . Sexual activity: Yes  Other Topics Concern  . Not on file  Social History Narrative  . Not on file   Social Determinants of Health   Financial Resource Strain: Not on file  Food Insecurity: Not on file  Transportation Needs: Not on file  Physical Activity: Not on file  Stress: Not on file  Social Connections: Not on file  Intimate Partner Violence: Not on file    Review of Systems  Constitutional: Negative for fatigue.  Respiratory: Positive for apnea.   Psychiatric/Behavioral: Positive for sleep disturbance.    Vitals:   05/07/20 1000  BP: 122/78  Pulse:  78  Temp: (!) 97.3 F (36.3 C)  SpO2: 98%     Physical Exam Constitutional:      Appearance: He is obese.  HENT:     Head: Normocephalic.     Mouth/Throat:     Comments: Crowded oropharynx, Mallampati 4 Eyes:     General:        Right eye: No discharge.        Left eye: No discharge.  Cardiovascular:     Rate and Rhythm: Normal rate and regular rhythm.     Pulses: Normal pulses.     Heart sounds: Normal heart sounds. No murmur heard.   Pulmonary:     Effort: Pulmonary effort is normal. No respiratory distress.     Breath sounds: Normal breath sounds. No stridor. No wheezing or rhonchi.  Musculoskeletal:     Cervical back: No rigidity or tenderness.  Neurological:     Mental Status: He is alert.   Psychiatric:        Mood and Affect: Mood normal.    Results of the Epworth flowsheet 07/31/2019  Sitting and reading 1  Watching TV 3  Sitting, inactive in a public place (e.g. a theatre or a meeting) 0  As a passenger in a car for an hour without a break 0  Lying down to rest in the afternoon when circumstances permit 2  Sitting and talking to someone 0  Sitting quietly after a lunch without alcohol 1  In a car, while stopped for a few minutes in traffic 0  Total score 7    Compliance report shows 100% compliance with CPAP Set between 5 and 15 Residual AHI of 0.2 Median pressure of 7.8, 95 percentile pressure of 11  Assessment:   Moderate obstructive sleep apnea on CPAP therapy  Energy levels have improved, daytime sleepiness is improved  Importance of weight loss, regular exercises encouraged  Plan/Recommendations: Continue using CPAP on a nightly basis  Follow-up in 6 months  Virl Diamond MD  [facility] 05/07/2020, 10:03 AM  CC: Joaquim Nam, MD

## 2020-05-07 NOTE — Patient Instructions (Signed)
Continue using CPAP on a nightly basis  Weight loss efforts  I will see you back in 6 months  Call with any significant concerns

## 2020-11-03 ENCOUNTER — Other Ambulatory Visit: Payer: Self-pay

## 2020-11-03 ENCOUNTER — Encounter: Payer: Self-pay | Admitting: Pulmonary Disease

## 2020-11-03 ENCOUNTER — Ambulatory Visit (INDEPENDENT_AMBULATORY_CARE_PROVIDER_SITE_OTHER): Payer: Managed Care, Other (non HMO) | Admitting: Pulmonary Disease

## 2020-11-03 VITALS — BP 120/74 | HR 72 | Temp 98.2°F | Ht 65.0 in | Wt 224.0 lb

## 2020-11-03 DIAGNOSIS — G4733 Obstructive sleep apnea (adult) (pediatric): Secondary | ICD-10-CM

## 2020-11-03 DIAGNOSIS — Z9989 Dependence on other enabling machines and devices: Secondary | ICD-10-CM

## 2020-11-03 NOTE — Patient Instructions (Signed)
Continue using CPAP nightly  Continue weight loss efforts  I will see you a year from now  Call with any significant concerns

## 2020-11-03 NOTE — Progress Notes (Signed)
Curtis Adams    [id number]    08-24-1985  Primary Care Physician:Duncan, Dwana Curd, MD  Referring Physician: Joaquim Nam, MD [address] 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  Energy levels are better Daytime symptoms are improving  Using CPAP nightly with no significant concerns  Sleep habits have not changed Wakes up feeling like is at a good nights rest  Usually goes to bed about 930 to 10:30 PM Takes him about 5 minutes or less to fall asleep 3 or more awakenings Usual wake up time between 5 AM and 6:30 AM  He does feel CPAP is helping him No headaches  His dad has obstructive sleep apnea  Never smoker  Memory is good Focus is good   Outpatient Encounter Medications as of 05/07/2020  Medication Sig   fluticasone (FLONASE SENSIMIST) 27.5 MCG/SPRAY nasal spray 2 sprays each nostril   levocetirizine (XYZAL) 5 MG tablet Take 5 mg by mouth every evening.   Olopatadine HCl 0.6 % SOLN 1-2 sprays in each nostril   Olopatadine HCl 0.6 % SOLN Place into both nostrils.   No facility-administered encounter medications on file as of 05/07/2020.    Allergies as of 05/07/2020 - Review Complete 05/07/2020  Allergen Reaction Noted   Other  06/11/2019    Past Medical History:  Diagnosis Date   Arthritis    Complication of anesthesia    1st surgery gas was used and pt was very sick (N/V)   Heart murmur    as a baby   PONV (postoperative nausea and vomiting)    Subtrochanteric fracture of hip (HCC)    Left    Past Surgical History:  Procedure Laterality Date   EYE SURGERY     3 surgeries as a child   FEMUR IM NAIL Left 04/06/2017   Procedure:  INTRAMEDULLARY (IM) NAIL LEFT HIP RECON NAILING;  Surgeon: Marcene Corning, MD;  Location: [facility] OR;  Service: Orthopedics;  Laterality: Left;  BIOMET/ZIMMER LEFT HIP RECON NAILING (NOT FIBULA)   TONSILLECTOMY     and adenoidectomy    History reviewed. No pertinent family history.  Social History   Socioeconomic History   Marital status: Married    Spouse name: Toni Amend   Number of children: Not on file   Years of education: Not on file   Highest education level: Some college, no degree  Occupational History   Occupation: IT sales professional    Comment: Sports coach  Tobacco Use   Smoking status: Never Smoker   Smokeless tobacco: Never Used  Building services engineer Use: Never used  Substance and Sexual Activity   Alcohol use: Yes   Drug use: No   Sexual activity: Yes  Other Topics Concern   Not on file  Social History Narrative   Not on file   Social Determinants of Health   Financial Resource Strain: Not on file  Food Insecurity: Not on file  Transportation Needs: Not on file  Physical Activity: Not on file  Stress: Not on file  Social Connections: Not on file  Intimate Partner Violence: Not on file    Review of Systems  Constitutional:  Negative for fatigue.  Respiratory:  Positive for apnea.   Psychiatric/Behavioral:  Positive for sleep disturbance.    Vitals:   05/07/20 1000  BP: 122/78  Pulse: 78  Temp: (!) 97.3 F (36.3 C)  SpO2: 98%     Physical Exam Constitutional:      Appearance: He is obese.  HENT:     Head: Normocephalic.  Eyes:     General:        Right eye: No discharge.        Left eye: No discharge.  Cardiovascular:     Rate and Rhythm: Normal rate and regular rhythm.     Pulses: Normal pulses.     Heart sounds: Normal heart sounds. No murmur heard. Pulmonary:     Effort: Pulmonary effort is normal. No respiratory distress.     Breath sounds: Normal breath sounds. No stridor. No wheezing or rhonchi.  Musculoskeletal:      Cervical back: No rigidity or tenderness.  Neurological:     Mental Status: He is alert.  Psychiatric:        Mood and Affect: Mood normal.   Results of the Epworth flowsheet 07/31/2019  Sitting and reading 1  Watching TV 3  Sitting, inactive in a public place (e.g. a theatre or a meeting) 0  As a passenger in a car for an hour without a break 0  Lying down to rest in the afternoon when circumstances permit 2  Sitting and talking to someone 0  Sitting quietly after a lunch without alcohol 1  In a car, while stopped for a few minutes in traffic 0  Total score 7    Compliance report shows 100% compliance with CPAP Set between 5 and 15 Residual AHI of 0.3 Median pressure of 7.8, 95 percentile pressure of 10.1  Assessment:   Moderate obstructive sleep apnea on CPAP therapy excellent compliance with CPAP  Continues to benefit from CPAP on a nightly basis  Using it nightly  Working on weight loss  Plan/Recommendations: Continue CPAP on a nightly basis  Follow-up in a year  Encouraged to call with any significant concerns  Virl Diamond MD [facility] 05/07/2020, 10:03 AM  CC: Joaquim Nam, MD

## 2021-06-18 ENCOUNTER — Encounter: Payer: Self-pay | Admitting: Adult Health

## 2021-06-18 ENCOUNTER — Ambulatory Visit: Payer: Managed Care, Other (non HMO) | Admitting: Adult Health

## 2021-06-18 VITALS — BP 110/80 | HR 90 | Temp 98.5°F | Ht 65.0 in | Wt 216.0 lb

## 2021-06-18 DIAGNOSIS — M542 Cervicalgia: Secondary | ICD-10-CM | POA: Diagnosis not present

## 2021-06-18 DIAGNOSIS — R519 Headache, unspecified: Secondary | ICD-10-CM | POA: Diagnosis not present

## 2021-06-18 MED ORDER — CYCLOBENZAPRINE HCL 10 MG PO TABS: 10.0000 mg | ORAL_TABLET | Freq: Every day | ORAL | 0 refills | Status: AC

## 2021-06-18 MED ORDER — VALACYCLOVIR HCL 1 G PO TABS: 1000.0000 mg | ORAL_TABLET | Freq: Three times a day (TID) | ORAL | 0 refills | Status: AC

## 2021-06-18 MED ORDER — METHYLPREDNISOLONE 4 MG PO TBPK: ORAL_TABLET | ORAL | 0 refills | Status: AC

## 2021-06-18 NOTE — Progress Notes (Signed)
Subjective:    Patient ID: Curtis Adams, male    DOB: 10-Aug-1985, 36 y.o.   MRN: [id number]  HPI 36year old male who  has a past medical history of Arthritis, Complication of anesthesia, Heart murmur, PONV (postoperative nausea and vomiting), and Subtrochanteric fracture of hip (HCC).  He presents to the office today for an acute issue. His symptoms have been present for about a week.   His symptoms include right-sided neck pain that radiates to his right ear, feeling of his right ear canal being "swollen" as well as an irritation feels like a sunburn over the right side of his scalp.  He has not noticed any rash or bug bites.  He has not experienced fevers, chills, headaches, vision, or jaw pain with eating.  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  He reports right sided neck pain that radiates to his right ear. 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  Tobacco Use   Smoking status: Never   Smokeless tobacco: Never  Vaping Use   Vaping Use: Never used  Substance and Sexual Activity   Alcohol use: Yes   Drug use: No   Sexual activity: Yes  Other Topics Concern   Not on file  Social History Narrative   Not on file   Social Determinants of Health   Financial Resource Strain: Not on file  Food  Insecurity: Not on file  Transportation Needs: Not on file  Physical Activity: Not on file  Stress: Not on file  Social Connections: Not on file  Intimate Partner Violence: Not on file    Past Surgical History:  Procedure Laterality Date   EYE SURGERY     3 surgeries as a child   FEMUR IM NAIL Left 04/06/2017   Procedure: INTRAMEDULLARY (IM) NAIL LEFT HIP RECON NAILING;  Surgeon: Marcene Corning, MD;  Location: [facility] OR;  Service: Orthopedics;  Laterality: Left;  BIOMET/ZIMMER LEFT HIP RECON NAILING (NOT FIBULA)   TONSILLECTOMY     and adenoidectomy    No family history on file.  Allergies  Allergen Reactions   Other Other (See Comments)    Dogs, cats, mold- improved with xzyal and flonase    Current Outpatient Medications on File Prior to Visit  Medication Sig Dispense Refill   fluticasone (FLONASE SENSIMIST) 27.5 MCG/SPRAY nasal spray 2 sprays each nostril     levocetirizine (XYZAL) 5 MG tablet Take 5 mg by mouth every evening.     Olopatadine HCl 0.6 % SOLN 1-2 sprays in each nostril     No current facility-administered medications on file prior to visit.    BP 110/80   Pulse 90  Temp 98.5 F (36.9 C) (Oral)   Ht 5\' 5"  (1.651 m)   Wt 216 lb (98 kg)   SpO2 97%   BMI 35.94 kg/m       Objective:   Physical Exam Vitals and nursing note reviewed.  Constitutional:      Appearance: Normal appearance.  HENT:     Head:      Right Ear: Hearing, tympanic membrane, ear canal and external ear normal.     Nose: Nose normal. No congestion or rhinorrhea.     Mouth/Throat:     Mouth: Mucous membranes are moist.     Pharynx: Oropharynx is clear. No oropharyngeal exudate.  Eyes:     Extraocular Movements: Extraocular movements intact.     Pupils: Pupils are equal, round, and reactive to light.  Musculoskeletal:       Arms:     Comments: Tender to palpation. Trapezius muscle is tight   Skin:    General: Skin is warm and dry.     Capillary Refill: Capillary refill  takes less than 2 seconds.  Neurological:     General: No focal deficit present.     Mental Status: He is alert and oriented to person, place, and time.  Psychiatric:        Mood and Affect: Mood normal.        Behavior: Behavior normal.        Thought Content: Thought content normal.       Assessment & Plan:  Being that his symptoms are unilateral and do not go past midline there is concern for shingles that has not presented with vesicles just yet.  Due to the area in question I will treat with Valtrex 1000 mg 3 times daily x7 days.  We will also send in Medrol Dosepak  Partially this may also include muscle strain in his right trapezius.  We will use Flexeril that he can take at nighttime to help with this discomfort.  Advise follow-up with his PCP if not improving.  , NP
